# Patient Record
Sex: Female | Born: 1965 | Race: Black or African American | Hispanic: No | Smoking: Never smoker
Health system: Southern US, Community
[De-identification: ages and names within clinical notes are randomized; demographics above are authoritative.]

## PROBLEM LIST (undated history)

## (undated) DIAGNOSIS — K219 Gastro-esophageal reflux disease without esophagitis: Secondary | ICD-10-CM

## (undated) DIAGNOSIS — I10 Essential (primary) hypertension: Secondary | ICD-10-CM

## (undated) DIAGNOSIS — F329 Major depressive disorder, single episode, unspecified: Secondary | ICD-10-CM

## (undated) DIAGNOSIS — F32A Depression, unspecified: Secondary | ICD-10-CM

## (undated) DIAGNOSIS — D649 Anemia, unspecified: Secondary | ICD-10-CM

## (undated) DIAGNOSIS — M797 Fibromyalgia: Secondary | ICD-10-CM

## (undated) HISTORY — DX: Gastro-esophageal reflux disease without esophagitis: K21.9

## (undated) HISTORY — DX: Essential (primary) hypertension: I10

## (undated) HISTORY — DX: Major depressive disorder, single episode, unspecified: F32.9

## (undated) HISTORY — DX: Anemia, unspecified: D64.9

## (undated) HISTORY — DX: Depression, unspecified: F32.A

---

## 1992-10-23 HISTORY — PX: BREAST LUMPECTOMY: SHX2

## 1998-05-18 ENCOUNTER — Other Ambulatory Visit: Admission: RE | Admit: 1998-05-18 | Discharge: 1998-05-18 | Payer: Self-pay | Admitting: Obstetrics and Gynecology

## 1999-05-27 ENCOUNTER — Other Ambulatory Visit: Admission: RE | Admit: 1999-05-27 | Discharge: 1999-05-27 | Payer: Self-pay | Admitting: Obstetrics & Gynecology

## 2000-09-19 ENCOUNTER — Other Ambulatory Visit: Admission: RE | Admit: 2000-09-19 | Discharge: 2000-09-19 | Payer: Self-pay | Admitting: Obstetrics & Gynecology

## 2001-12-23 ENCOUNTER — Other Ambulatory Visit: Admission: RE | Admit: 2001-12-23 | Discharge: 2001-12-23 | Payer: Self-pay | Admitting: Obstetrics and Gynecology

## 2002-01-20 ENCOUNTER — Other Ambulatory Visit: Admission: RE | Admit: 2002-01-20 | Discharge: 2002-01-20 | Payer: Self-pay | Admitting: Obstetrics and Gynecology

## 2002-11-23 DIAGNOSIS — M797 Fibromyalgia: Secondary | ICD-10-CM

## 2002-11-23 HISTORY — DX: Fibromyalgia: M79.7

## 2003-04-01 ENCOUNTER — Other Ambulatory Visit: Admission: RE | Admit: 2003-04-01 | Discharge: 2003-04-01 | Payer: Self-pay | Admitting: Obstetrics and Gynecology

## 2003-04-29 ENCOUNTER — Inpatient Hospital Stay (HOSPITAL_COMMUNITY): Admission: AD | Admit: 2003-04-29 | Discharge: 2003-04-29 | Payer: Self-pay | Admitting: Obstetrics and Gynecology

## 2003-04-29 ENCOUNTER — Encounter: Payer: Self-pay | Admitting: Obstetrics and Gynecology

## 2003-06-10 ENCOUNTER — Encounter: Payer: Self-pay | Admitting: Obstetrics and Gynecology

## 2003-06-10 ENCOUNTER — Ambulatory Visit (HOSPITAL_COMMUNITY): Admission: RE | Admit: 2003-06-10 | Discharge: 2003-06-10 | Payer: Self-pay | Admitting: Obstetrics and Gynecology

## 2003-10-09 ENCOUNTER — Inpatient Hospital Stay (HOSPITAL_COMMUNITY): Admission: AD | Admit: 2003-10-09 | Discharge: 2003-10-11 | Payer: Self-pay | Admitting: Obstetrics and Gynecology

## 2003-10-14 ENCOUNTER — Encounter: Admission: RE | Admit: 2003-10-14 | Discharge: 2003-11-13 | Payer: Self-pay | Admitting: *Deleted

## 2003-11-14 ENCOUNTER — Encounter: Admission: RE | Admit: 2003-11-14 | Discharge: 2003-12-14 | Payer: Self-pay | Admitting: *Deleted

## 2003-12-15 ENCOUNTER — Encounter: Admission: RE | Admit: 2003-12-15 | Discharge: 2004-01-14 | Payer: Self-pay | Admitting: Obstetrics and Gynecology

## 2004-02-12 ENCOUNTER — Encounter: Admission: RE | Admit: 2004-02-12 | Discharge: 2004-03-13 | Payer: Self-pay | Admitting: *Deleted

## 2004-10-25 ENCOUNTER — Other Ambulatory Visit: Admission: RE | Admit: 2004-10-25 | Discharge: 2004-10-25 | Payer: Self-pay | Admitting: Obstetrics and Gynecology

## 2005-03-23 ENCOUNTER — Inpatient Hospital Stay (HOSPITAL_COMMUNITY): Admission: AD | Admit: 2005-03-23 | Discharge: 2005-03-26 | Payer: Self-pay | Admitting: Obstetrics and Gynecology

## 2005-04-02 ENCOUNTER — Inpatient Hospital Stay (HOSPITAL_COMMUNITY): Admission: AD | Admit: 2005-04-02 | Discharge: 2005-04-02 | Payer: Self-pay | Admitting: Obstetrics and Gynecology

## 2005-05-09 ENCOUNTER — Other Ambulatory Visit: Admission: RE | Admit: 2005-05-09 | Discharge: 2005-05-09 | Payer: Self-pay | Admitting: Obstetrics and Gynecology

## 2005-06-19 ENCOUNTER — Encounter (HOSPITAL_COMMUNITY): Admission: RE | Admit: 2005-06-19 | Discharge: 2005-09-17 | Payer: Self-pay | Admitting: Internal Medicine

## 2006-04-25 ENCOUNTER — Inpatient Hospital Stay (HOSPITAL_COMMUNITY): Admission: AD | Admit: 2006-04-25 | Discharge: 2006-04-28 | Payer: Self-pay | Admitting: Obstetrics and Gynecology

## 2006-05-02 ENCOUNTER — Inpatient Hospital Stay (HOSPITAL_COMMUNITY): Admission: AD | Admit: 2006-05-02 | Discharge: 2006-05-02 | Payer: Self-pay | Admitting: Obstetrics and Gynecology

## 2006-05-03 ENCOUNTER — Inpatient Hospital Stay (HOSPITAL_COMMUNITY): Admission: AD | Admit: 2006-05-03 | Discharge: 2006-05-05 | Payer: Self-pay | Admitting: Obstetrics and Gynecology

## 2006-05-23 ENCOUNTER — Encounter: Admission: RE | Admit: 2006-05-23 | Discharge: 2006-05-23 | Payer: Self-pay | Admitting: Internal Medicine

## 2006-05-23 ENCOUNTER — Encounter (INDEPENDENT_AMBULATORY_CARE_PROVIDER_SITE_OTHER): Payer: Self-pay | Admitting: Specialist

## 2006-05-23 ENCOUNTER — Other Ambulatory Visit: Admission: RE | Admit: 2006-05-23 | Discharge: 2006-05-23 | Payer: Self-pay | Admitting: Diagnostic Radiology

## 2006-06-07 ENCOUNTER — Other Ambulatory Visit: Admission: RE | Admit: 2006-06-07 | Discharge: 2006-06-07 | Payer: Self-pay | Admitting: Obstetrics and Gynecology

## 2007-07-01 ENCOUNTER — Other Ambulatory Visit: Admission: RE | Admit: 2007-07-01 | Discharge: 2007-07-01 | Payer: Self-pay | Admitting: Obstetrics and Gynecology

## 2008-12-02 ENCOUNTER — Emergency Department (HOSPITAL_COMMUNITY): Admission: EM | Admit: 2008-12-02 | Discharge: 2008-12-02 | Payer: Self-pay | Admitting: Emergency Medicine

## 2011-03-10 NOTE — Discharge Summary (Signed)
Victoria Curry, Victoria Curry              ACCOUNT NO.:  [id number]   MEDICAL RECORD NO.:  [id number]          PATIENT TYPE:  INP   LOCATION:  9105                          [facility]   PHYSICIAN:  James A. Ashley Royalty, M.D.DATE OF BIRTH:  08/08/1966   DATE OF ADMISSION:  04/25/2006  DATE OF DISCHARGE:  04/28/2006                                 DISCHARGE SUMMARY   DISCHARGE DIAGNOSES:  1.  Intrauterine pregnancy at 39-1/[redacted] weeks gestation, delivered.  2.  Advanced maternal age.  3.  Group B Streptococcus carrier.  4.  Term birth of living child, vertex.   CONSULTATIONS:  None.   DISCHARGE MEDICATIONS:  1.  __________ 100.  2.  Motrin 600 mg.   HISTORY AND PHYSICAL:  This is 45 year old, gravida 5, para 4, with the  aforementioned diagnoses, presenting complaining of contractions.  Initial  cervical examination revealed 3 cm diltation, 70% effacement, -1 station,  vertex presentation.   HOSPITAL COURSE:  The patient was admitted to [facility]  [place].  Initial laboratory studies were drawn.  She went on to labor  and delivered on April 26, 2006.  The infant was a 8 pound 14 ounce female,  Apgars 9 at 1 minute, 9 at 5 minutes, sent to newborn nursery.  Delivery was  accomplished by Dr. Sylvester Harder over an intact perineum.  There was a  small first-degree introital laceration which was repaired without  difficulty.  The patient's postpartum course was benign.  She was discharged  to home on April 28, 2006, afebrile and in satisfactory condition.   DISPOSITION:  The patient is to return to [facility]  in approximately 6 weeks for postpartum evaluation.      James A. Ashley Royalty, M.D.  Electronically Signed     JAM/MEDQ  D:  05/16/2006  T:  05/16/2006  Job:  [id number]

## 2011-03-10 NOTE — Discharge Summary (Signed)
Victoria Curry, Victoria Curry              ACCOUNT NO.:  [id number]   MEDICAL RECORD NO.:  [id number]          PATIENT TYPE:  INP   LOCATION:  9160                          [facility]   PHYSICIAN:  James A. Ashley Royalty, M.D.DATE OF BIRTH:  26-Feb-1966   DATE OF ADMISSION:  05/03/2006  DATE OF DISCHARGE:  05/05/2006                                 DISCHARGE SUMMARY   DISCHARGE DIAGNOSES:  1.  Status post spontaneous vaginal delivery on or about April 28, 2006.  2.  Preeclampsia - resolving.   CONSULTATIONS:  None.   DISCHARGE MEDICATIONS:  Prenatal vitamins.   HISTORY AND PHYSICAL:  This is a 45 year old gravida 5, para 5-0-0-5  approximately 5 days post vaginal delivery, presented the evening prior to  admission complaining of headache and blood pressure was noted to be 140-150  over 80-90.  PIH panel was obtained and the patient sent home per Dr. Richardson Dopp.  She was seen at Dr. Ashley Royalty' office on the day of admission complaining of  a headache.  PIH panel was rechecked and SGPT was noted to be 76, SGOT 44.  The patient was admitted for magnesium prophylaxis, monitoring of liver  function tests.  For the remainder of the history and physical please see  chart.   HOSPITAL COURSE:  The patient was admitted to [facility]  [place].  She was begun on magnesium sulfate.  Procardia was  discontinued.  PIH panel was obtained on July 14.  The SGOT had returned to  22, SGPT 48.  Blood pressures were 120s over 60s at this point.  Magnesium  sulfate was subsequently discontinued.  After a period of observation the  patient was deemed stable for discharge and was discharged home afebrile and  in satisfactory condition.   DISPOSITION:  The patient is to return to the office next week for further  evaluation and therapy.      James A. Ashley Royalty, M.D.  Electronically Signed     JAM/MEDQ  D:  05/16/2006  T:  05/16/2006  Job:  [id number]

## 2011-03-10 NOTE — H&P (Signed)
Victoria Curry, Victoria Curry              ACCOUNT NO.:  [id number]   MEDICAL RECORD NO.:  [id number]          PATIENT TYPE:  MAT   LOCATION:  MATC                          [facility]  [facility]   PHYSICIAN:  Rudy Jew. Ashley Royalty, M.D.DATE OF BIRTH:  Sep 10, 1966   DATE OF ADMISSION:  03/23/2005  DATE OF DISCHARGE:                                HISTORY & PHYSICAL   HISTORY OF PRESENT ILLNESS:  A 45 year old gravida 4, para 3, EDC March 26, 2005, at 39 weeks 4 days gestation.  Prenatal care was complicated by  advanced maternal age and fibroid uterus (small).  The patient did have a  genetic amniocentesis which revealed normal chromosomes.   The patient presented tonight complaining of contractions.  On the monitor,  they were not sufficiently frequent to warrant admission, however, there was  one sustained contraction which was accompanied by significant deceleration  which appeared late in onset.  The subsequent tracing appeared beautiful  with nice reactivity and no further decelerations.  Due to the fact that the  patient is 39+ weeks gestation and had a cervical examination quite  favorable for delivery (3 to 4 cm/50% effaced/high/vertex), the patient was  admitted for overnight observation with induction in the morning.   MEDICATIONS:  Vitamins.   PAST MEDICAL HISTORY:  Benign.   ALLERGIES:  No known drug allergies.   FAMILY HISTORY:  Noncontributory.   SOCIAL HISTORY:  The patient denies the use of tobacco or significant  alcohol.   REVIEW OF SYSTEMS:  Noncontributory.   PHYSICAL EXAMINATION:  GENERAL:  A well-developed, well-nourished, pleasant  black female in no acute distress.  VITAL SIGNS:  Afebrile, vital signs stable.  SKIN:  Warm and dry without lesions.  LYMPHS:  There is no supraclavicular, cervical, or inguinal adenopathy.  HEENT:  Normocephalic.  NECK:  Supple without thyromegaly.  CHEST:  Lungs are clear.  HEART:  Regular rate and rhythm without murmurs, rubs, or  gallops.  ABDOMEN:  Gravid with a term fundal height.  Contractions are irregular.  Fetal heart tones are approximately 140 with accels.  Please see above  noted.  MUSCULOSKELETAL:  No CVA tenderness.  PELVIC:  Per RN - see above.   Biophysical profile revealed a value of 6/8.  The amniotic fluid was noted  to be 10.9 cm.   IMPRESSION:  1.  Intrauterine pregnancy at 39+ weeks gestation.  2.  Advanced cervical changes.  3.  Late decelerations after sustained contraction x1 only.   PLAN:  Admit and observe the patient overnight.  Induction in a.m.      JAM/MEDQ  D:  03/23/2005  T:  03/23/2005  Job:  [id number]
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
## 2011-03-10 NOTE — Consult Note (Signed)
Victoria Curry, Victoria Curry              ACCOUNT NO.:  [id number]   MEDICAL RECORD NO.:  [id number]          PATIENT TYPE:  MAT   LOCATION:  MATC                          [facility]   PHYSICIAN:  Charles A. Delcambre, MDDATE OF BIRTH:  07/05/1966   DATE OF CONSULTATION:  04/02/2005  DATE OF DISCHARGE:                                   CONSULTATION   EMERGENCY ROOM CONSULTATION:   REASON FOR CONSULTATION:  45 year old para 4-0-0-4 status post  vaginal delivery on June 2 now postpartum day #9.  Last pregnancy  complicated by pregnancy-induced hypertension right at the end of the  pregnancy, on blood pressure medicine during the pregnancy at the last week  induced and then nothing after the pregnancy, no magnesium prophylaxis  during that pregnancy, this pregnancy normal pregnancy without hypertension  per patient during this pregnancy, no complications at delivery with  hypertension per history.  Old chart not available for my review at this  time.  She presented this evening complaining of headache onset 1530 hours  this afternoon, no scotomata, no right upper quadrant pain, no blurred  vision but temporal headache and checked her blood pressure at home with a  home cuff and got 120/100.  She was seen last week on Monday, 6 days ago,  with Dr. Ashley Royalty, noted to have a high pressure that day, not specified  what it was, was told to come back the next day for blood pressure check and  pressure was normal.  She did not have any blood work done at that time and  had been on normal activity since that time.  She states she had some  swelling today and was directed to come in for blood pressure check and lab  work to be done.   PAST MEDICAL HISTORY:  None.   SURGICAL HISTORY:  Lumpectomy breast benign.  SVD x4.   MEDICATIONS:  None.   ALLERGIES:  CODEINE causes itching.   SOCIAL HISTORY:  No tobacco, ethanol or drug use.  Patient is married in a  monogamous relationship with  her husband.   FAMILY HISTORY:  Unrelated.   REVIEW OF SYSTEMS:  Review as noted above.   PHYSICAL EXAMINATION:  GENERAL:  Patient alert and oriented x3.  VITAL SIGNS:  Blood pressure initially here 151/104, repeat 162/101, 149/95,  164/108, 157/102, 158/97, respirations 18, pulse 55, temperature 98.6.  HEENT EXAM:  Grossly within normal limits.  NECK:  Neck supple without thyromegaly or adenopathy.  LUNGS:  Clear bilaterally.  HEART:  Regular rate and rhythm, 2/6 systolic ejection murmur left sternal  border.  BREASTS:  Symmetrical.  Remainder of exam deferred.  ABDOMEN:  Soft, flat, nontender, fundus firm 16 weeks size.  Pelvic exam  deferred.  EXTREMITIES:  Moderate pitting edema up to mid calf.   LABORATORIES:  LDH 241.  CMP, SGOT/SGPT normal.  Creatinine 1.  Uric acid  8.1.  CBC:  Platelets 324, hemoglobin 11.5, hematocrit 34.6.  Urine negative  for protein.   ASSESSMENT:  Late onset pregnancy-induced hypertension.   PLAN:  Recommendation for admission and magnesium prophylaxis.  Patient  refuses admission at this time for family reasons.  Discussion is made that  she may have seizure but stroke may be a risk as well.  Oral agents will be  administered to try to control hypertension on an outpatient basis and she  is in agreement with this recommendation at least in that she will not agree  to admission, she accepts risks as noted above and we will go ahead and  start Aldomet 500 mg b.i.d. first dose tonight, prescription will be filled,  she will take second dose in the morning, call Dr. Ashley Royalty' office to the  nurse practitioner in his absence tomorrow and she will notify covering  doctor, Dr. Mia Creek tomorrow if pressure is high.  Otherwise, she is  strictly precautioned if headache is to worsen, scotomata develop, right  upper quadrant pain or blurred vision or any other worsening factors or if  she just does not feel right she is to the return to the MAU as I would   recommend admission and magnesium prophylaxis.  She is in agreement and  accepts risks as noted and will follow up as directed.  What I would like to  give her is labetalol but we will withhold this secondary to her low pulse  rate in the 50s at this time.       CAD/MEDQ  D:  04/02/2005  T:  04/02/2005  Job:  [id number]

## 2013-01-09 ENCOUNTER — Emergency Department (HOSPITAL_COMMUNITY): Admission: EM | Admit: 2013-01-09 | Discharge: 2013-01-09 | Discharge status: Absent without leave | Payer: Self-pay

## 2013-03-10 ENCOUNTER — Encounter (HOSPITAL_COMMUNITY): Payer: Self-pay | Admitting: *Deleted

## 2013-03-10 DIAGNOSIS — K219 Gastro-esophageal reflux disease without esophagitis: Secondary | ICD-10-CM | POA: Insufficient documentation

## 2013-03-10 DIAGNOSIS — K3189 Other diseases of stomach and duodenum: Secondary | ICD-10-CM | POA: Insufficient documentation

## 2013-03-10 DIAGNOSIS — Z79899 Other long term (current) drug therapy: Secondary | ICD-10-CM | POA: Insufficient documentation

## 2013-03-10 DIAGNOSIS — R109 Unspecified abdominal pain: Secondary | ICD-10-CM | POA: Insufficient documentation

## 2013-03-10 DIAGNOSIS — Z8739 Personal history of other diseases of the musculoskeletal system and connective tissue: Secondary | ICD-10-CM | POA: Insufficient documentation

## 2013-03-10 DIAGNOSIS — Z3202 Encounter for pregnancy test, result negative: Secondary | ICD-10-CM | POA: Insufficient documentation

## 2013-03-10 DIAGNOSIS — Z91040 Latex allergy status: Secondary | ICD-10-CM | POA: Insufficient documentation

## 2013-03-10 LAB — COMPREHENSIVE METABOLIC PANEL
AST: 19 U/L (ref 0–37)
Alkaline Phosphatase: 71 U/L (ref 39–117)
Calcium: 9.5 mg/dL (ref 8.4–10.5)
Chloride: 102 mEq/L (ref 96–112)
Creatinine, Ser: 0.77 mg/dL (ref 0.50–1.10)
GFR calc Af Amer: >90 mL/min (ref >90)
GFR calc non Af Amer: >90 mL/min (ref >90)
Potassium: 4 mEq/L (ref 3.5–5.1)
Sodium: 135 mEq/L (ref 135–145)
Total Bilirubin: 0.2 mg/dL — ABNORMAL LOW (ref 0.3–1.2)

## 2013-03-10 LAB — CBC WITH DIFFERENTIAL/PLATELET
Basophils Absolute: 0 10*3/uL (ref 0.0–0.1)
Basophils Relative: 0 % (ref 0–1)
Eosinophils Absolute: 0.1 10*3/uL (ref 0.0–0.7)
MCH: 29.7 pg (ref 26.0–34.0)
MCV: 89.9 fL (ref 78.0–100.0)
Monocytes Absolute: 0.7 10*3/uL (ref 0.1–1.0)
Neutrophils Relative %: 55 % (ref 43–77)
RBC: 3.87 MIL/uL (ref 3.87–5.11)
WBC: 8.9 10*3/uL (ref 4.0–10.5)

## 2013-03-10 LAB — URINALYSIS, ROUTINE W REFLEX MICROSCOPIC
Glucose, UA: NEGATIVE mg/dL
Leukocytes, UA: NEGATIVE
Specific Gravity, Urine: 1.008 (ref 1.005–1.030)
Urobilinogen, UA: 0.2 mg/dL (ref 0.0–1.0)

## 2013-03-10 LAB — URINE MICROSCOPIC-ADD ON

## 2013-03-10 LAB — POCT PREGNANCY, URINE: Preg Test, Ur: NEGATIVE

## 2013-03-10 MED ORDER — ONDANSETRON 4 MG PO TBDP
8.0000 mg | ORAL_TABLET | Freq: Once | ORAL | Status: AC
  Administered 2013-03-10: 8 mg via ORAL
  Filled 2013-03-10: qty 2

## 2013-03-10 NOTE — ED Notes (Signed)
C/o indigestion, nausea

## 2013-03-11 ENCOUNTER — Emergency Department (HOSPITAL_COMMUNITY): Payer: BC Managed Care – PPO

## 2013-03-11 ENCOUNTER — Emergency Department (HOSPITAL_COMMUNITY)
Admission: EM | Admit: 2013-03-11 | Discharge: 2013-03-11 | Disposition: A | Discharge status: Home / Self Care | Payer: BC Managed Care – PPO | Attending: Emergency Medicine | Admitting: Emergency Medicine

## 2013-03-11 DIAGNOSIS — R1010 Upper abdominal pain, unspecified: Secondary | ICD-10-CM

## 2013-03-11 DIAGNOSIS — R1013 Epigastric pain: Secondary | ICD-10-CM

## 2013-03-11 DIAGNOSIS — R110 Nausea: Secondary | ICD-10-CM

## 2013-03-11 HISTORY — DX: Fibromyalgia: M79.7

## 2013-03-11 MED ORDER — ONDANSETRON 8 MG PO TBDP: 8.0000 mg | ORAL_TABLET | Freq: Three times a day (TID) | ORAL | Status: DC | PRN

## 2013-03-11 MED ORDER — DICYCLOMINE HCL 10 MG PO CAPS
20.0000 mg | ORAL_CAPSULE | Freq: Once | ORAL | Status: AC
  Administered 2013-03-11: 20 mg via ORAL
  Filled 2013-03-11: qty 2

## 2013-03-11 MED ORDER — DICYCLOMINE HCL 20 MG PO TABS: 20.0000 mg | ORAL_TABLET | Freq: Four times a day (QID) | ORAL | Status: DC | PRN

## 2013-03-11 NOTE — ED Notes (Signed)
Patient given discharge instructions for abdominal pain. rx for zofran and bentyl. Advised to follow up with primary care. Patient voiced understanding of all instructions and had no further questions. Patient ambulated to front lobby without difficulty.

## 2013-03-11 NOTE — ED Provider Notes (Signed)
History     CSN: [id number]  Arrival date & time 03/10/13  2140   First MD Initiated Contact with Patient 03/11/13 0037      Chief Complaint  Patient presents with  . Nausea  . Gastrophageal Reflux    (Consider location/radiation/quality/duration/timing/severity/associated sxs/prior treatment) HPI 47 yo female presents to the ER with complaint of upper abdominal pain with radiation into her right shoulder area.  She has had nausea and vomiting.  No fevers.  Sxs started around 9 pm. No improvement with belching.  Pt c/o "bubble" in her right upper back.  No prior h/o same.  Past Medical History  Diagnosis Date  . Fibromyalgia     Past Surgical History  Procedure Laterality Date  . Breast lumpectomy      No family history on file.  History  Substance Use Topics  . Smoking status: Never Smoker   . Smokeless tobacco: Not on file  . Alcohol Use: No    OB History   Grav Para Term Preterm Abortions TAB SAB Ect Mult Living                  Review of Systems  See History of Present Illness; otherwise all other systems are reviewed and negative  Allergies  Adhesive; Codeine; and Latex  Home Medications   Current Outpatient Rx  Name  Route  Sig  Dispense  Refill  . dexlansoprazole (DEXILANT) 60 MG capsule   Oral   Take 60 mg by mouth daily as needed (for indigestion).         . DULoxetine (CYMBALTA) 30 MG capsule   Oral   Take 30 mg by mouth daily.         Marland Kitchen losartan (COZAAR) 100 MG tablet   Oral   Take 100 mg by mouth daily.         . nebivolol (BYSTOLIC) 10 MG tablet   Oral   Take 10 mg by mouth daily.         Marland Kitchen dicyclomine (BENTYL) 20 MG tablet   Oral   Take 1 tablet (20 mg total) by mouth every 6 (six) hours as needed (for abdominal cramping).   20 tablet   0   . ondansetron (ZOFRAN-ODT) 8 MG disintegrating tablet   Oral   Take 1 tablet (8 mg total) by mouth every 8 (eight) hours as needed for nausea.   20 tablet   0     BP 141/101   Pulse 65  Temp(Src) 97.9 F (36.6 C) (Oral)  Resp 18  SpO2 100%  LMP 02/20/2013  Physical Exam  Nursing note and vitals reviewed. Constitutional: She is oriented to person, place, and time. She appears well-developed and well-nourished.  HENT:  Head: Normocephalic and atraumatic.  Nose: Nose normal.  Mouth/Throat: Oropharynx is clear and moist.  Eyes: Conjunctivae and EOM are normal. Pupils are equal, round, and reactive to light.  Neck: Normal range of motion. Neck supple. No JVD present. No tracheal deviation present. No thyromegaly present.  Cardiovascular: Normal rate, regular rhythm, normal heart sounds and intact distal pulses.  Exam reveals no gallop and no friction rub.   No murmur heard. Pulmonary/Chest: Effort normal and breath sounds normal. No stridor. No respiratory distress. She has no wheezes. She has no rales. She exhibits no tenderness.  Abdominal: Soft. Bowel sounds are normal. She exhibits no distension and no mass. There is tenderness (mild right upper quadrant and epigastric pain). There is no rebound and no guarding.  Musculoskeletal: Normal range of motion. She exhibits no edema and no tenderness.  Lymphadenopathy:    She has no cervical adenopathy.  Neurological: She is alert and oriented to person, place, and time. She exhibits normal muscle tone. Coordination normal.  Skin: Skin is warm and dry. No rash noted. No erythema. No pallor.  Psychiatric: She has a normal mood and affect. Her behavior is normal. Judgment and thought content normal.    ED Course  Procedures (including critical care time)  Labs Reviewed  CBC WITH DIFFERENTIAL - Abnormal; Notable for the following:    Hemoglobin 11.5 (*)    HCT 34.8 (*)    All other components within normal limits  URINALYSIS, ROUTINE W REFLEX MICROSCOPIC - Abnormal; Notable for the following:    Hgb urine dipstick TRACE (*)    All other components within normal limits  COMPREHENSIVE METABOLIC PANEL - Abnormal;  Notable for the following:    Total Bilirubin 0.2 (*)    All other components within normal limits  URINE MICROSCOPIC-ADD ON  POCT PREGNANCY, URINE   US Abdomen Complete  03/11/2013   *RADIOLOGY REPORT*  Clinical Data:  Right upper quadrant abdominal pain.  COMPLETE ABDOMINAL ULTRASOUND  Comparison:  None.  Findings:  Gallbladder:  No gallstones, gallbladder wall thickening, or pericholecystic fluid.  Common bile duct:  Normal in caliber measuring a maximum of 5.63mm.  Liver:  Normal echogenicity.  No biliary dilatation.  There is a 3.5 x 2.1 x 2.9 cm multi septated cyst.  IVC:  Normal caliber.  Pancreas:  Sonographically normal.  Spleen:  Normal size and echogenicity.  A small calcification is noted, likely a benign granuloma.  Right Kidney:  11.4 cm in length. Normal renal cortical thickness and echogenicity without focal lesions or hydronephrosis.  Left Kidney:  10.5 cm in length. Normal renal cortical thickness and echogenicity without focal lesions or hydronephrosis.  Abdominal aorta:  Normal caliber.  IMPRESSION:  1.  Normal gallbladder and normal caliber common bile duct. 2.  Multiseptated cystic lesion in the right hepatic lobe. Recommend follow-up ultrasound examination in 6 months.   Original Report Authenticated By: Rudie Meyer, M.D.     1. Nausea   2. Upper abdominal pain   3. Dyspepsia       MDM  47 year old female with epigastric pain radiating into her right shoulder.  Concern for gallstones.  Ultrasound, however, does not show any specific gallbladder disease.  Patient feeling much better after Bentyl and Zofran.  Will have her followup with her primary care Dr. with possible referral to gastroenterology as needed.        Olivia Mackie, MD 03/12/13 [phone]

## 2013-03-11 NOTE — ED Notes (Signed)
Pharmacy in room at this time.

## 2013-03-11 NOTE — ED Notes (Signed)
Patient ambulating to restroom without difficulty at this time. 

## 2013-07-09 ENCOUNTER — Other Ambulatory Visit: Payer: Self-pay | Admitting: Internal Medicine

## 2013-07-09 DIAGNOSIS — K7689 Other specified diseases of liver: Secondary | ICD-10-CM

## 2013-09-01 ENCOUNTER — Other Ambulatory Visit: Payer: BC Managed Care – PPO

## 2013-09-24 ENCOUNTER — Encounter: Payer: Self-pay | Admitting: Family

## 2013-10-02 ENCOUNTER — Ambulatory Visit
Admission: RE | Admit: 2013-10-02 | Discharge: 2013-10-02 | Disposition: A | Discharge status: Home / Self Care | Payer: BC Managed Care – PPO | Source: Ambulatory Visit | Attending: Internal Medicine | Admitting: Internal Medicine

## 2013-10-02 DIAGNOSIS — K7689 Other specified diseases of liver: Secondary | ICD-10-CM

## 2013-11-06 ENCOUNTER — Other Ambulatory Visit: Payer: Self-pay | Admitting: Internal Medicine

## 2013-11-06 DIAGNOSIS — K7689 Other specified diseases of liver: Secondary | ICD-10-CM

## 2013-11-11 ENCOUNTER — Ambulatory Visit
Admission: RE | Admit: 2013-11-11 | Discharge: 2013-11-11 | Disposition: A | Discharge status: Home / Self Care | Payer: BC Managed Care – PPO | Source: Ambulatory Visit | Attending: Internal Medicine | Admitting: Internal Medicine

## 2013-11-11 DIAGNOSIS — K7689 Other specified diseases of liver: Secondary | ICD-10-CM

## 2013-11-11 MED ORDER — GADOBENATE DIMEGLUMINE 529 MG/ML IV SOLN
18.0000 mL | Freq: Once | INTRAVENOUS | Status: AC | PRN
  Administered 2013-11-11: 18 mL via INTRAVENOUS

## 2014-01-25 IMAGING — US US ABDOMEN COMPLETE
1 series · 14 of 25 positions shown · non-contrast
Comparison: None.

CLINICAL DATA: Followup complex hepatic cyst.

EXAM:
ULTRASOUND ABDOMEN COMPLETE

[Series 1: us abdomen complete · 0.26mm/px · 14 of 89 slices shown]
[im 1/89]
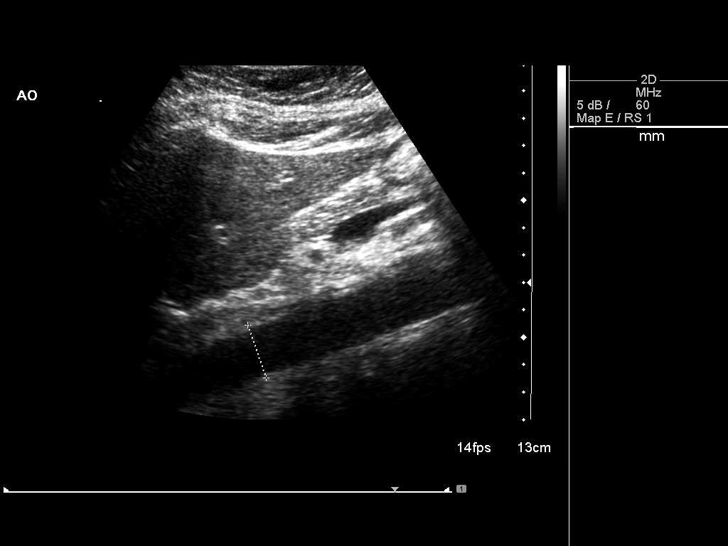
[im 8/89]
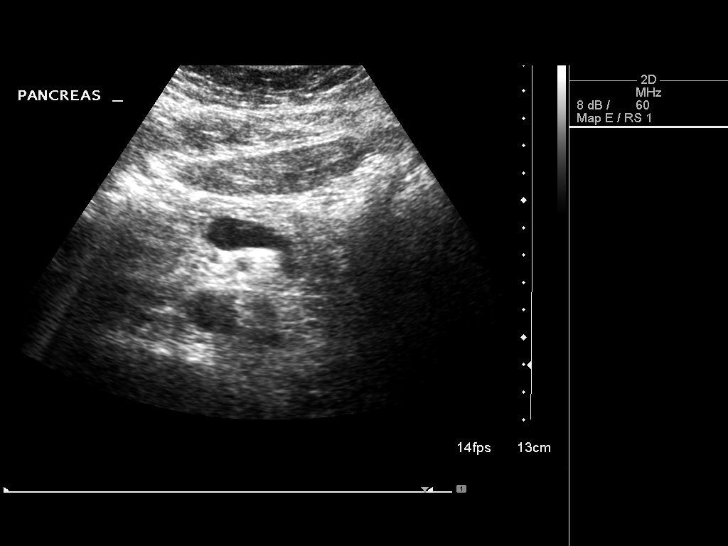
[im 15/89]
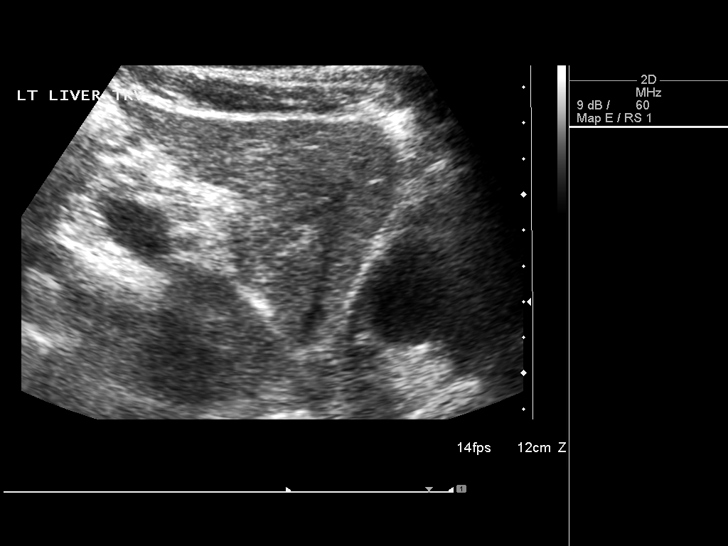
[im 23/89]
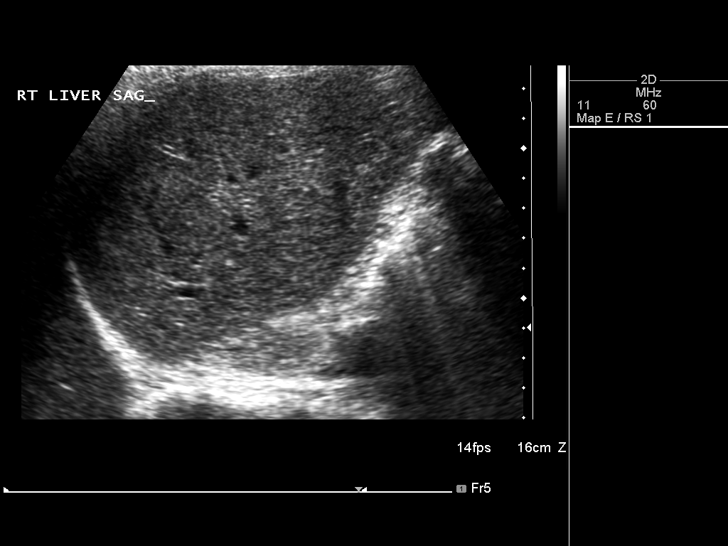
[im 30/89]
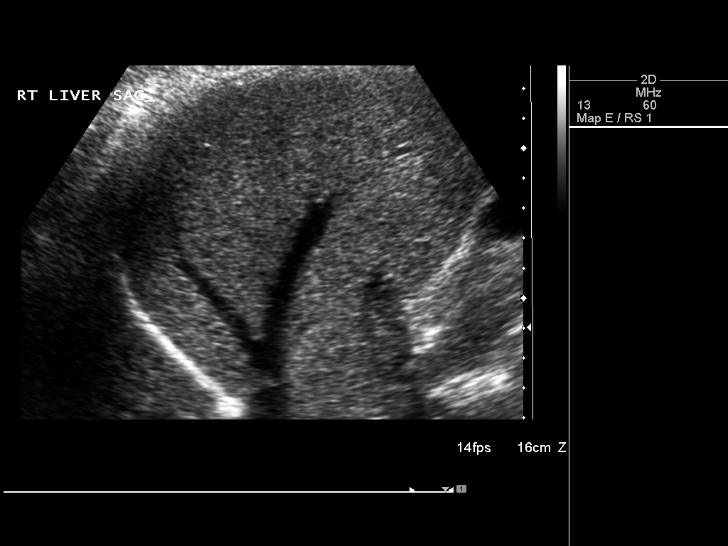
[im 34/89]
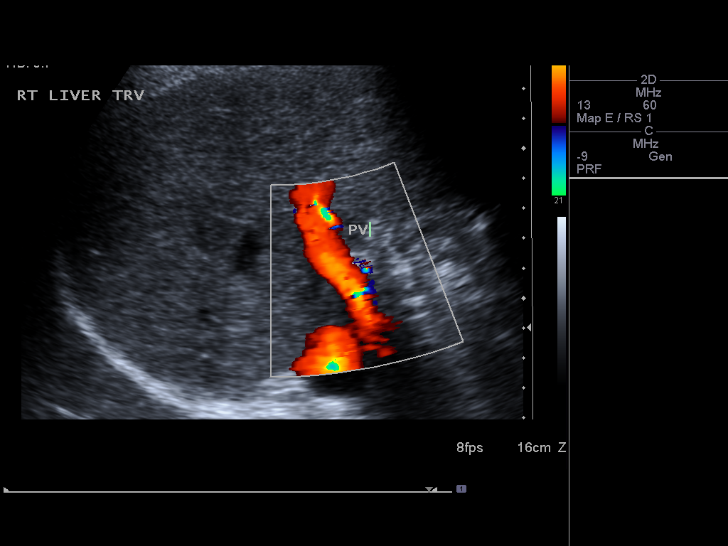
[im 41/89]
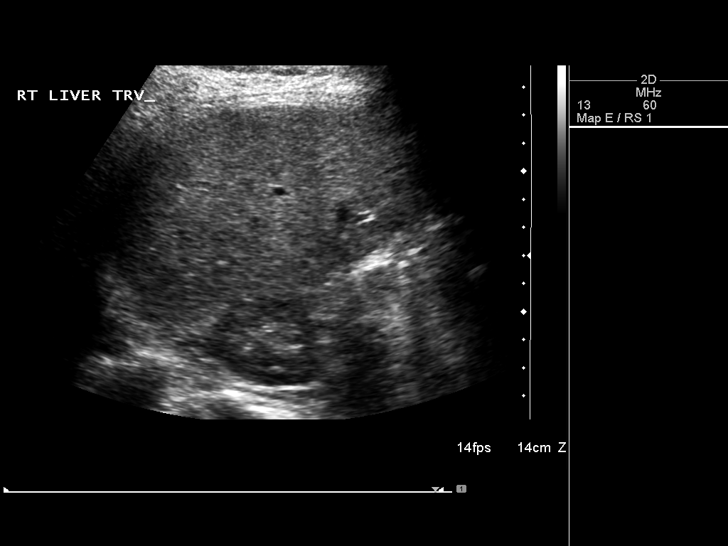
[im 48/89]
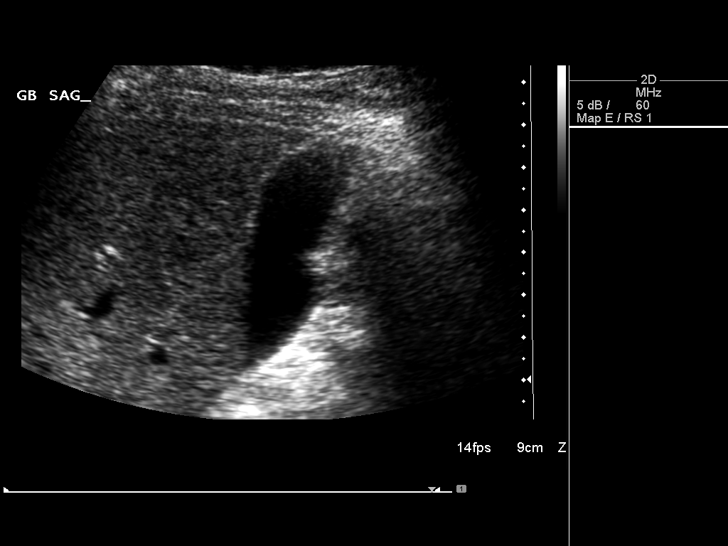
[im 56/89]
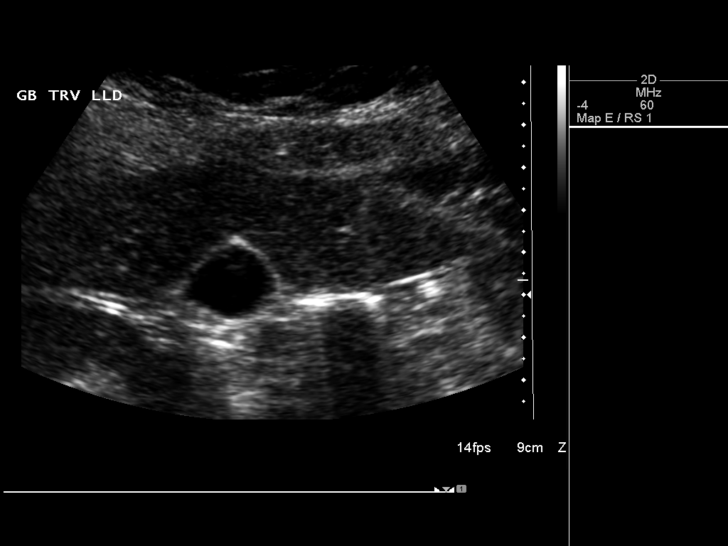
[im 59/89]
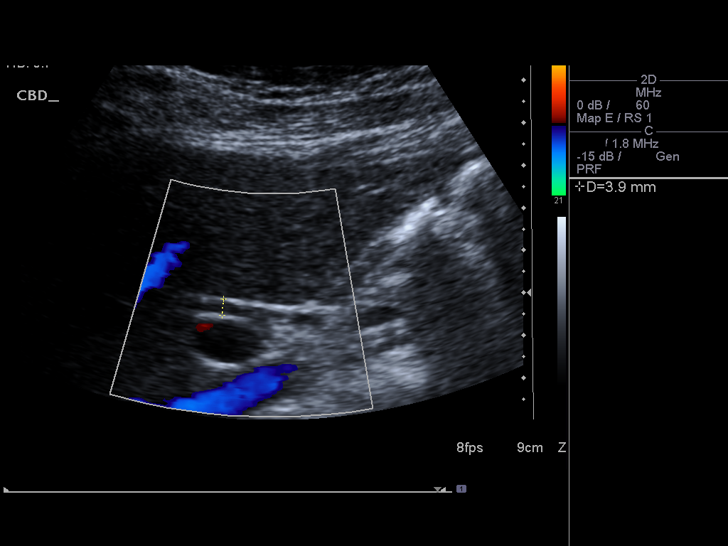
[im 67/89]
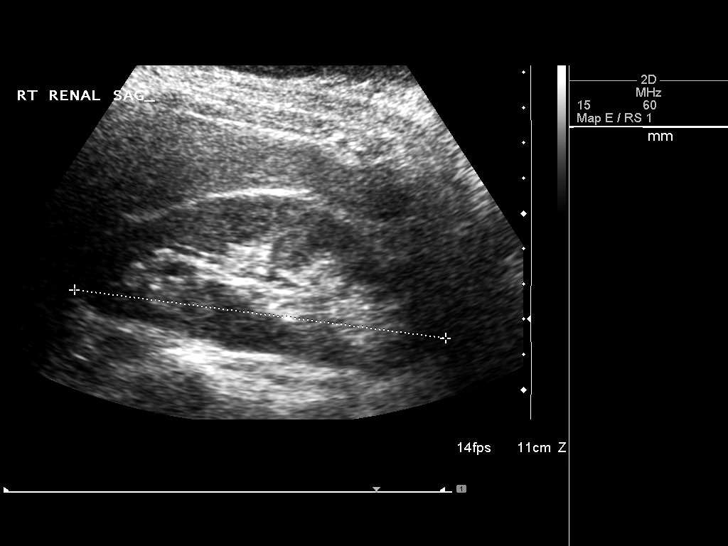
[im 74/89]
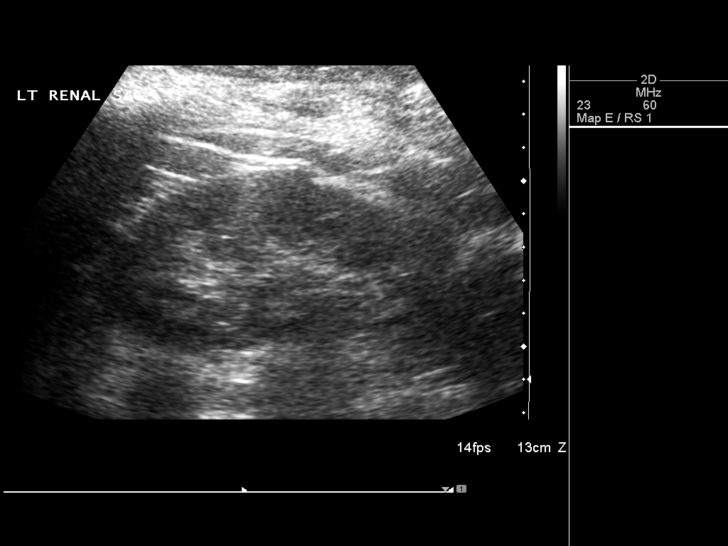
[im 81/89]
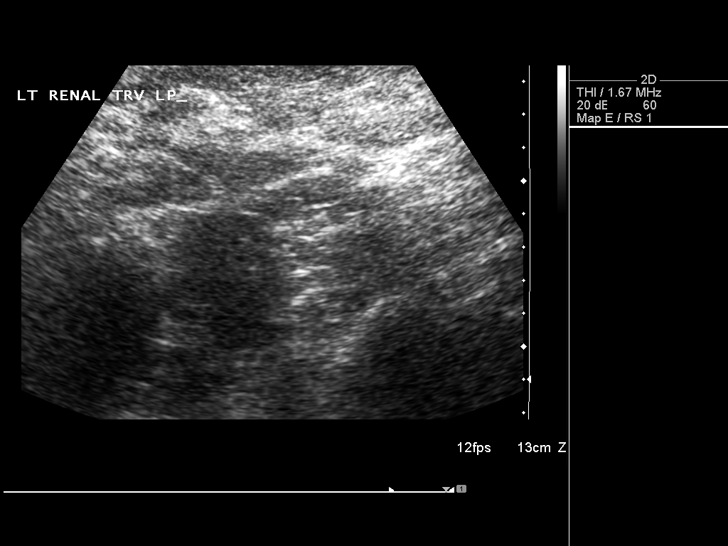
[im 89/89]
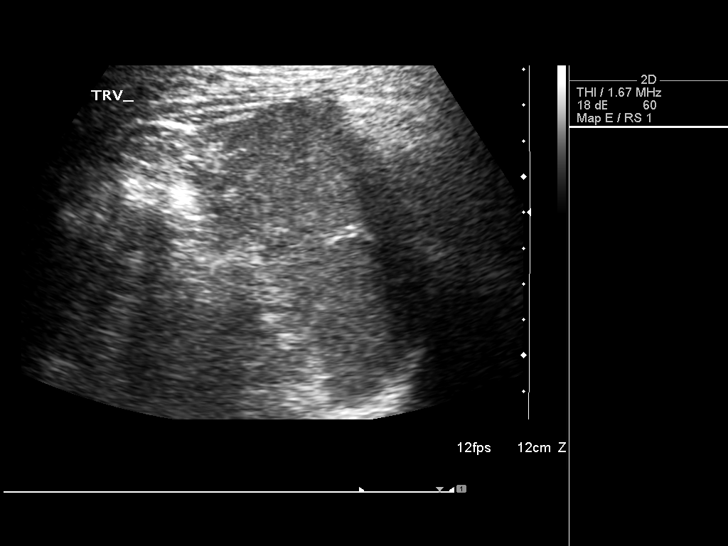

[14 of 25 positions shown; findings below may reference images not displayed]

FINDINGS: Gallbladder:

No gallstones or wall thickening visualized. No sonographic Murphy
sign noted.

Common bile duct:

Diameter: 4 mm

Liver:

A cyst containing several thin internal septations is again seen in
the right hepatic lobe. This measures 3.5 x 2.7 x 4.0 cm, which is
mildly increased in size since previous study. No definite solid
components visualized. No other liver lesions identified.

IVC:

No abnormality visualized.

Pancreas:

Visualized portion unremarkable.

Spleen:

Size and appearance within normal limits.

Right Kidney:

Length: 11.6 cm. Echogenicity within normal limits. No mass or
hydronephrosis visualized.

Left Kidney:

Length: 10.6 cm. Echogenicity within normal limits. No mass or
hydronephrosis visualized.

Abdominal aorta:

No aneurysm visualized.

Other findings:

None.
IMPRESSION: 4 cm complex hepatic cyst measurements are mildly increased since
prior study, although morphology of this lesion shows no significant
change. Recommend further characterization by abdomen MRI without
and with contrast.

## 2015-01-08 ENCOUNTER — Ambulatory Visit: Payer: BC Managed Care – PPO | Attending: Internal Medicine

## 2015-08-30 ENCOUNTER — Ambulatory Visit: Payer: BC Managed Care – PPO

## 2015-09-01 ENCOUNTER — Ambulatory Visit: Payer: BC Managed Care – PPO | Attending: Internal Medicine

## 2015-10-08 ENCOUNTER — Ambulatory Visit (INDEPENDENT_AMBULATORY_CARE_PROVIDER_SITE_OTHER): Payer: Self-pay | Admitting: Family Medicine

## 2015-10-08 ENCOUNTER — Non-Acute Institutional Stay (HOSPITAL_COMMUNITY)
Admission: AD | Admit: 2015-10-08 | Discharge: 2015-10-08 | Disposition: A | Discharge status: Home / Self Care | Payer: BC Managed Care – PPO | Attending: Internal Medicine | Admitting: Internal Medicine

## 2015-10-08 ENCOUNTER — Encounter: Payer: Self-pay | Admitting: Family Medicine

## 2015-10-08 VITALS — BP 160/94 | HR 72 | Temp 98.4°F | Resp 16 | Ht 67.0 in | Wt 192.0 lb

## 2015-10-08 DIAGNOSIS — F329 Major depressive disorder, single episode, unspecified: Secondary | ICD-10-CM

## 2015-10-08 DIAGNOSIS — I10 Essential (primary) hypertension: Secondary | ICD-10-CM

## 2015-10-08 DIAGNOSIS — M797 Fibromyalgia: Secondary | ICD-10-CM

## 2015-10-08 DIAGNOSIS — E8881 Metabolic syndrome: Secondary | ICD-10-CM | POA: Insufficient documentation

## 2015-10-08 DIAGNOSIS — F32A Depression, unspecified: Secondary | ICD-10-CM

## 2015-10-08 DIAGNOSIS — K219 Gastro-esophageal reflux disease without esophagitis: Secondary | ICD-10-CM | POA: Insufficient documentation

## 2015-10-08 DIAGNOSIS — Z87891 Personal history of nicotine dependence: Secondary | ICD-10-CM | POA: Insufficient documentation

## 2015-10-08 LAB — COMPLETE METABOLIC PANEL WITH GFR
ALT: 11 U/L (ref 6–29)
AST: 17 U/L (ref 10–35)
Albumin: 3.7 g/dL (ref 3.6–5.1)
Alkaline Phosphatase: 53 U/L (ref 33–115)
BILIRUBIN TOTAL: 0.4 mg/dL (ref 0.2–1.2)
BUN: 11 mg/dL (ref 7–25)
CALCIUM: 8.8 mg/dL (ref 8.6–10.2)
CHLORIDE: 102 mmol/L (ref 98–110)
CO2: 27 mmol/L (ref 20–31)
CREATININE: 0.75 mg/dL (ref 0.50–1.10)
GFR, Est African American: >89 mL/min (ref >=60)
GFR, Est Non African American: >89 mL/min (ref >=60)
Glucose, Bld: 77 mg/dL (ref 65–99)
Potassium: 3.9 mmol/L (ref 3.5–5.3)
Sodium: 137 mmol/L (ref 135–146)
TOTAL PROTEIN: 6.6 g/dL (ref 6.1–8.1)

## 2015-10-08 LAB — CBC WITH DIFFERENTIAL/PLATELET
Basophils Absolute: 0 K/uL (ref 0.0–0.1)
Basophils Relative: 0 % (ref 0–1)
Eosinophils Absolute: 0.1 K/uL (ref 0.0–0.7)
Eosinophils Relative: 1 % (ref 0–5)
HCT: 34.3 % — ABNORMAL LOW (ref 36.0–46.0)
Hemoglobin: 11.1 g/dL — ABNORMAL LOW (ref 12.0–15.0)
Lymphocytes Relative: 27 % (ref 12–46)
Lymphs Abs: 2.2 K/uL (ref 0.7–4.0)
MCH: 30 pg (ref 26.0–34.0)
MCHC: 32.4 g/dL (ref 30.0–36.0)
MCV: 92.7 fL (ref 78.0–100.0)
MPV: 9.4 fL (ref 8.6–12.4)
Monocytes Absolute: 0.8 K/uL (ref 0.1–1.0)
Monocytes Relative: 10 % (ref 3–12)
Neutro Abs: 5 K/uL (ref 1.7–7.7)
Neutrophils Relative %: 62 % (ref 43–77)
Platelets: 350 K/uL (ref 150–400)
RBC: 3.7 MIL/uL — ABNORMAL LOW (ref 3.87–5.11)
RDW: 14.1 % (ref 11.5–15.5)
WBC: 8 K/uL (ref 4.0–10.5)

## 2015-10-08 LAB — POCT URINALYSIS DIP (DEVICE)
Bilirubin Urine: NEGATIVE
Glucose, UA: NEGATIVE mg/dL
Ketones, ur: NEGATIVE mg/dL
Leukocytes, UA: NEGATIVE
Nitrite: NEGATIVE
Protein, ur: NEGATIVE mg/dL
Specific Gravity, Urine: 1.025 (ref 1.005–1.030)
Urobilinogen, UA: 0.2 mg/dL (ref 0.0–1.0)
pH: 6 (ref 5.0–8.0)

## 2015-10-08 LAB — LIPID PANEL
Cholesterol: 131 mg/dL (ref 125–200)
HDL: 48 mg/dL
LDL Cholesterol: 70 mg/dL
Total CHOL/HDL Ratio: 2.7 ratio
Triglycerides: 67 mg/dL
VLDL: 13 mg/dL

## 2015-10-08 LAB — TSH: TSH: 1.488 u[IU]/mL (ref 0.350–4.500)

## 2015-10-08 MED ORDER — DULOXETINE HCL 30 MG PO CPEP: 30.0000 mg | ORAL_CAPSULE | Freq: Two times a day (BID) | ORAL | Status: DC

## 2015-10-08 MED ORDER — LOSARTAN POTASSIUM 100 MG PO TABS: 100.0000 mg | ORAL_TABLET | Freq: Every day | ORAL | Status: DC

## 2015-10-08 MED ORDER — DEXLANSOPRAZOLE 60 MG PO CPDR: 60.0000 mg | DELAYED_RELEASE_CAPSULE | Freq: Every day | ORAL | Status: DC

## 2015-10-08 NOTE — Progress Notes (Signed)
Subjective:    Patient ID: Victoria Curry, female    DOB: [birth year], 49 y.o.   MRN: [id number]  HPI  Victoria Curry, a 49 year old female with a history of hypertension, GERD, fibromyalgia, and depression presents to establish care. She was previously under the care of Dr. Waunita Schooner, [facility], did not follow up due to lack of payer source. Patient has a history of hypertension, and states that she ran out of medications in August. She is not exercising and is not adherent to low salt diet. She does not check blood pressures at home.   Patient denies chest pain, dyspnea, fatigue, lower extremity edema, palpitations and tachypnea.  Paitent complains of heartburn. This has been associated with belching and eructation, heartburn and nausea.  She denies chest pain, choking on food, difficulty swallowing, dysphagia, early satiety, fullness after meals, midespigastric pain, need to clear throat frequently and shortness of breath. Symptoms have been present for several years. She denies dysphagia.  She has not lost weight. She denies melena, hematochezia, hematemesis, and coffee ground emesis. Medical therapy in the past has included proton pump inhibitors she state that she was on Protonix 20 mg. Patient also complains of depression. She complains of depressed mood and fatigue. She has a history of fibromyalgia, which is related to stress. .  She denies current suicidal and homicidal plan or intent. Symptoms are currently controlled on Cymbalta twice daily Past Medical History  Diagnosis Date  . Fibromyalgia   . Hypertension   . Depression   . GERD (gastroesophageal reflux disease)    Social History   Social History  . Marital Status: Married    Spouse Name: N/A  . Number of Children: N/A  . Years of Education: N/A   Occupational History  . Not on file.   Social History Main Topics  . Smoking status: Former Research scientist (life sciences)  . Smokeless tobacco: Not on file  . Alcohol Use:  No  . Drug Use: No  . Sexual Activity: Not on file   Other Topics Concern  . Not on file   Social History Narrative   Allergies  Allergen Reactions  . Adhesive [Tape] Other (See Comments)    Skin irritation   . Codeine Itching  . Latex Other (See Comments)    Skin irritation     Review of Systems  Constitutional: Positive for fatigue. Negative for fever, activity change, appetite change and unexpected weight change.  HENT: Negative.   Eyes: Negative.  Negative for visual disturbance.  Respiratory: Negative.  Negative for apnea, chest tightness and shortness of breath.   Cardiovascular: Negative.   Gastrointestinal: Negative.  Negative for nausea, abdominal pain, diarrhea, constipation and blood in stool.       History of heartburn  Endocrine: Negative.  Negative for polydipsia, polyphagia and polyuria.  Musculoskeletal: Positive for myalgias and arthralgias.  Skin: Negative.   Allergic/Immunologic: Negative.  Negative for immunocompromised state.  Neurological: Negative.  Negative for dizziness, weakness and numbness.  Hematological: Negative.   Psychiatric/Behavioral: Negative.        Depression controlled on current medication regimen       Objective:   Physical Exam  Constitutional: She is oriented to person, place, and time. She appears well-developed and well-nourished.  HENT:  Head: Normocephalic and atraumatic.  Right Ear: External ear normal.  Left Ear: External ear normal.  Mouth/Throat: Oropharynx is clear and moist.  Eyes: Conjunctivae are normal. Pupils are equal, round, and reactive to light.  Neck: Normal range of motion. Neck supple.  Cardiovascular: Normal rate, regular rhythm, normal heart sounds and intact distal pulses.   Pulmonary/Chest: Effort normal and breath sounds normal.  Abdominal: Soft. Bowel sounds are normal.  Musculoskeletal: Normal range of motion.  Neurological: She is alert and oriented to person, place, and time. She has normal  reflexes.  Skin: Skin is warm and dry.  Psychiatric: She has a normal mood and affect. Her behavior is normal. Judgment and thought content normal.         BP 160/94 mmHg  Pulse 72  Temp(Src) 98.4 F (36.9 C) (Oral)  Resp 16  Ht 5\' 7"  (1.702 m)  Wt 192 lb (87.091 kg)  BMI 30.06 kg/m2  LMP 09/19/2015 Assessment & Plan:   1. Essential hypertension Blood pressure is above goal. Patient has not taken anti-hypertensive medications over the past several months due to loss of insurance benefits. Will re-start Losartan 100 mg daily. The patient is asked to make an attempt to improve diet and exercise patterns to aid in medical management of this problem. Will follow up in 1 month for hypertension. Reviewed urinalysis, no proteinuria present.  - losartan (COZAAR) 100 MG tablet; Take 1 tablet (100 mg total) by mouth daily. Reported on 10/08/2015  Dispense: 30 tablet; Refill: 0  2. Gastroesophageal reflux disease without esophagitis Discussed diet to prevent GERD at length. Also, discussed interventions. Patient expressed understanding. Will start dexilant 60 mg as previously prescribed. Patient given samples in office.  - dexlansoprazole (DEXILANT) 60 MG capsule; Take 1 capsule (60 mg total) by mouth daily. Reported on 10/08/2015  Dispense: 30 capsule; Refill: 0  3. Metabolic syndrome Current BMI is 30.7, abdominal girth is also increased. Recommend a lowfat, low carbohydrate diet divided over 5-6 small meals, increase water intake to 6-8 glasses, and 150 minutes per week of cardiovascular exercise.   - POCT urinalysis dipstick - COMPLETE METABOLIC PANEL WITH GFR - CBC with Differential - TSH - Hemoglobin A1c - Lipid Panel  4. Fibromyalgia Will continue Cymbalta as previously prescribed.   5. Depression Reviewed PHQ-9. Patient has discussed stressors at length. She is currently under the care of counselor, Dr. Cephus Slater for depression. She denies suicidal or homicidal ideations.   - DULoxetine (CYMBALTA) 30 MG capsule; Take 1 capsule (30 mg total) by mouth 2 (two) times daily.  Dispense: 60 capsule;     Routine Health Maintenance:  Recommend annual mammogram Recommend returning for vaccinations due to recent cold symptoms Recommend pap smear in 3 months   RTC: 2 weeks for blood pressure check and 1 month for hypertension  Shahida Schnackenberg M, FNP

## 2015-10-08 NOTE — Patient Instructions (Addendum)
Recommend a lowfat, low carbohydrate diet divided over 5-6 small meals, increase water intake to 6-8 glasses, and 150 minutes per week of cardiovascular exercise.   Patient will return to office for a blood pressure check in 2 weeks after starting Amlodipine.   Recommend increase fluid intake  DASH Eating Plan DASH stands for "Dietary Approaches to Stop Hypertension." The DASH eating plan is a healthy eating plan that has been shown to reduce high blood pressure (hypertension). Additional health benefits may include reducing the risk of type 2 diabetes mellitus, heart disease, and stroke. The DASH eating plan may also help with weight loss. WHAT DO I NEED TO KNOW ABOUT THE DASH EATING PLAN? For the DASH eating plan, you will follow these general guidelines:  Choose foods with a percent daily value for sodium of less than 5% (as listed on the food label).  Use salt-free seasonings or herbs instead of table salt or sea salt.  Check with your health care provider or pharmacist before using salt substitutes.  Eat lower-sodium products, often labeled as "lower sodium" or "no salt added."  Eat fresh foods.  Eat more vegetables, fruits, and low-fat dairy products.  Choose whole grains. Look for the word "whole" as the first word in the ingredient list.  Choose fish and skinless chicken or [place] more often than red meat. Limit fish, poultry, and meat to 6 oz (170 g) each day.  Limit sweets, desserts, sugars, and sugary drinks.  Choose heart-healthy fats.  Limit cheese to 1 oz (28 g) per day.  Eat more home-cooked food and less restaurant, buffet, and fast food.  Limit fried foods.  Cook foods using methods other than frying.  Limit canned vegetables. If you do use them, rinse them well to decrease the sodium.  When eating at a restaurant, ask that your food be prepared with less salt, or no salt if possible. WHAT FOODS CAN I EAT? Seek help from a dietitian for individual calorie  needs. Grains Whole grain or whole wheat bread. Brown rice. Whole grain or whole wheat pasta. Quinoa, bulgur, and whole grain cereals. Low-sodium cereals. Corn or whole wheat flour tortillas. Whole grain cornbread. Whole grain crackers. Low-sodium crackers. Vegetables Fresh or frozen vegetables (raw, steamed, roasted, or grilled). Low-sodium or reduced-sodium tomato and vegetable juices. Low-sodium or reduced-sodium tomato sauce and paste. Low-sodium or reduced-sodium canned vegetables.  Fruits All fresh, canned (in natural juice), or frozen fruits. Meat and Other Protein Products Ground beef (85% or leaner), grass-fed beef, or beef trimmed of fat. Skinless chicken or [place]. Ground chicken or [place]. Pork trimmed of fat. All fish and seafood. Eggs. Dried beans, peas, or lentils. Unsalted nuts and seeds. Unsalted canned beans. Dairy Low-fat dairy products, such as skim or 1% milk, 2% or reduced-fat cheeses, low-fat ricotta or cottage cheese, or plain low-fat yogurt. Low-sodium or reduced-sodium cheeses. Fats and Oils Tub margarines without trans fats. Light or reduced-fat mayonnaise and salad dressings (reduced sodium). Avocado. Safflower, olive, or canola oils. Natural peanut or almond butter. Other Unsalted popcorn and pretzels. The items listed above may not be a complete list of recommended foods or beverages. Contact your dietitian for more options. WHAT FOODS ARE NOT RECOMMENDED? Grains White bread. White pasta. White rice. Refined cornbread. Bagels and croissants. Crackers that contain trans fat. Vegetables Creamed or fried vegetables. Vegetables in a cheese sauce. Regular canned vegetables. Regular canned tomato sauce and paste. Regular tomato and vegetable juices. Fruits Dried fruits. Canned fruit in light or heavy syrup.  Fruit juice. Meat and Other Protein Products Fatty cuts of meat. Ribs, chicken wings, bacon, sausage, bologna, salami, chitterlings, fatback, hot dogs, bratwurst,  and packaged luncheon meats. Salted nuts and seeds. Canned beans with salt. Dairy Whole or 2% milk, cream, half-and-half, and cream cheese. Whole-fat or sweetened yogurt. Full-fat cheeses or blue cheese. Nondairy creamers and whipped toppings. Processed cheese, cheese spreads, or cheese curds. Condiments Onion and garlic salt, seasoned salt, table salt, and sea salt. Canned and packaged gravies. Worcestershire sauce. Tartar sauce. Barbecue sauce. Teriyaki sauce. Soy sauce, including reduced sodium. Steak sauce. Fish sauce. Oyster sauce. Cocktail sauce. Horseradish. Ketchup and mustard. Meat flavorings and tenderizers. Bouillon cubes. Hot sauce. Tabasco sauce. Marinades. Taco seasonings. Relishes. Fats and Oils Butter, stick margarine, lard, shortening, ghee, and bacon fat. Coconut, palm kernel, or palm oils. Regular salad dressings. Other Pickles and olives. Salted popcorn and pretzels. The items listed above may not be a complete list of foods and beverages to avoid. Contact your dietitian for more information. WHERE CAN I FIND MORE INFORMATION? National Heart, Lung, and Blood Institute: travelstabloid.com   This information is not intended to replace advice given to you by your health care provider. Make sure you discuss any questions you have with your health care provider.   Document Released: 09/28/2011 Document Revised: 10/30/2014 Document Reviewed: 08/13/2013 Elsevier Interactive Patient Education 2016 [place]. Gastroesophageal Reflux Disease, Adult Normally, food travels down the esophagus and stays in the stomach to be digested. If a person has gastroesophageal reflux disease (GERD), food and stomach acid move back up into the esophagus. When this happens, the esophagus becomes sore and swollen (inflamed). Over time, GERD can make small holes (ulcers) in the lining of the esophagus. HOME CARE Diet  Follow a diet as told by your doctor. You may  need to avoid foods and drinks such as:  Coffee and tea (with or without caffeine).  Drinks that contain alcohol.  Energy drinks and sports drinks.  Carbonated drinks or sodas.  Chocolate and cocoa.  Peppermint and mint flavorings.  Garlic and onions.  Horseradish.  Spicy and acidic foods, such as peppers, chili powder, curry powder, vinegar, hot sauces, and BBQ sauce.  Citrus fruit juices and citrus fruits, such as oranges, lemons, and limes.  Tomato-based foods, such as red sauce, chili, salsa, and pizza with red sauce.  Fried and fatty foods, such as donuts, french fries, potato chips, and high-fat dressings.  High-fat meats, such as hot dogs, rib eye steak, sausage, ham, and bacon.  High-fat dairy items, such as whole milk, butter, and cream cheese.  Eat small meals often. Avoid eating large meals.  Avoid drinking large amounts of liquid with your meals.  Avoid eating meals during the 2-3 hours before bedtime.  Avoid lying down right after you eat.  Do not exercise right after you eat. General Instructions  Pay attention to any changes in your symptoms.  Take over-the-counter and prescription medicines only as told by your doctor. Do not take aspirin, ibuprofen, or other NSAIDs unless your doctor says it is okay.  Do not use any tobacco products, including cigarettes, chewing tobacco, and e-cigarettes. If you need help quitting, ask your doctor.  Wear loose clothes. Do not wear anything tight around your waist.  Raise (elevate) the head of your bed about 6 inches (15 cm).  Try to lower your stress. If you need help doing this, ask your doctor.  If you are overweight, lose an amount of weight that is healthy  for you. Ask your doctor about a safe weight loss goal.  Keep all follow-up visits as told by your doctor. This is important. GET HELP IF:  You have new symptoms.  You lose weight and you do not know why it is happening.  You have trouble  swallowing, or it hurts to swallow.  You have wheezing or a cough that keeps happening.  Your symptoms do not get better with treatment.  You have a hoarse voice. GET HELP RIGHT AWAY IF:  You have pain in your arms, neck, jaw, teeth, or back.  You feel sweaty, dizzy, or light-headed.  You have chest pain or shortness of breath.  You throw up (vomit) and your throw up looks like blood or coffee grounds.  You pass out (faint).  Your poop (stool) is bloody or black.  You cannot swallow, drink, or eat.   This information is not intended to replace advice given to you by your health care provider. Make sure you discuss any questions you have with your health care provider.   Document Released: 03/27/2008 Document Revised: 06/30/2015 Document Reviewed: 02/03/2015 Elsevier Interactive Patient Education [employer].

## 2015-10-09 ENCOUNTER — Encounter: Payer: Self-pay | Admitting: Family Medicine

## 2015-10-09 DIAGNOSIS — K219 Gastro-esophageal reflux disease without esophagitis: Secondary | ICD-10-CM | POA: Insufficient documentation

## 2015-10-09 DIAGNOSIS — M797 Fibromyalgia: Secondary | ICD-10-CM | POA: Insufficient documentation

## 2015-10-09 DIAGNOSIS — F32A Depression, unspecified: Secondary | ICD-10-CM | POA: Insufficient documentation

## 2015-10-09 DIAGNOSIS — F329 Major depressive disorder, single episode, unspecified: Secondary | ICD-10-CM | POA: Insufficient documentation

## 2015-10-09 DIAGNOSIS — E8881 Metabolic syndrome: Secondary | ICD-10-CM | POA: Insufficient documentation

## 2015-10-09 DIAGNOSIS — I10 Essential (primary) hypertension: Secondary | ICD-10-CM | POA: Insufficient documentation

## 2015-10-09 LAB — HEMOGLOBIN A1C
Hgb A1c MFr Bld: 5.2 % (ref <5.7)
Mean Plasma Glucose: 103 mg/dL (ref <117)

## 2015-10-11 ENCOUNTER — Telehealth: Payer: Self-pay

## 2015-10-11 NOTE — Telephone Encounter (Signed)
Called, no answer. Left message asking patient to call back regarding labs. Thanks!

## 2015-10-11 NOTE — Telephone Encounter (Signed)
Patient called  Back and was advised of labs and recommendations. Patient states verbal understanding and will keep follow up appointment. Thanks!

## 2015-10-11 NOTE — Telephone Encounter (Signed)
-----   Message from Dorena Dew, [place] sent at 10/09/2015 11:45 AM EST ----- Please inform patient that she is mildly anemic at 11.1, range is 12-15. Recommend that patient increase green, leafy veggies, organ meats, legumes and protein sources. Will repeat in 6 months. Start medications for hypertension and GERD as discussed during appt. Will follow up for hypertension in 1 month as discussed. All other labs are within normal range. She can view lab values on My Chart.   Thanks  ----- Message -----    From: [facility] in [facility]    Sent: 10/09/2015   1:42 AM      To: Dorena Dew, FNP

## 2015-10-22 ENCOUNTER — Other Ambulatory Visit: Payer: BC Managed Care – PPO

## 2015-10-22 ENCOUNTER — Telehealth: Payer: Self-pay | Admitting: Family Medicine

## 2015-10-22 ENCOUNTER — Other Ambulatory Visit: Payer: Self-pay | Admitting: Family Medicine

## 2015-10-22 ENCOUNTER — Other Ambulatory Visit: Payer: Self-pay

## 2015-10-22 ENCOUNTER — Telehealth: Payer: Self-pay

## 2015-10-22 DIAGNOSIS — K219 Gastro-esophageal reflux disease without esophagitis: Secondary | ICD-10-CM

## 2015-10-22 MED ORDER — OMEPRAZOLE 20 MG PO CPDR: 20.0000 mg | DELAYED_RELEASE_CAPSULE | Freq: Every day | ORAL | Status: DC

## 2015-10-22 NOTE — Telephone Encounter (Signed)
[place], this was the replacement for dexilant. Patient states it gives her headaches. Please advise. Thanks!

## 2015-10-22 NOTE — Telephone Encounter (Signed)
Patient called to state Prilosec gives her headaches.

## 2015-10-22 NOTE — Telephone Encounter (Signed)
Patient is stating her dexilant is making her nauseous is there anything thing else that she could take? Please advise. Thanks!

## 2015-11-03 MED FILL — ?OMEPRAZOLE DR 20 MG CAPSUL: 20 | 30 days supply | Qty: 30 | Fill #0

## 2015-11-11 ENCOUNTER — Ambulatory Visit (INDEPENDENT_AMBULATORY_CARE_PROVIDER_SITE_OTHER): Payer: Self-pay | Admitting: Family Medicine

## 2015-11-11 VITALS — BP 164/102 | HR 70 | Temp 97.9°F | Resp 16 | Ht 67.0 in | Wt 191.0 lb

## 2015-11-11 DIAGNOSIS — Z23 Encounter for immunization: Secondary | ICD-10-CM

## 2015-11-11 DIAGNOSIS — I10 Essential (primary) hypertension: Secondary | ICD-10-CM

## 2015-11-11 DIAGNOSIS — D649 Anemia, unspecified: Secondary | ICD-10-CM

## 2015-11-11 DIAGNOSIS — F329 Major depressive disorder, single episode, unspecified: Secondary | ICD-10-CM

## 2015-11-11 DIAGNOSIS — F32A Depression, unspecified: Secondary | ICD-10-CM

## 2015-11-11 DIAGNOSIS — K219 Gastro-esophageal reflux disease without esophagitis: Secondary | ICD-10-CM

## 2015-11-11 LAB — POCT URINALYSIS DIP (DEVICE)
Bilirubin Urine: NEGATIVE
Glucose, UA: NEGATIVE mg/dL
KETONES UR: NEGATIVE mg/dL
Leukocytes, UA: NEGATIVE
Nitrite: NEGATIVE
PROTEIN: NEGATIVE mg/dL
SPECIFIC GRAVITY, URINE: 1.025 (ref 1.005–1.030)
UROBILINOGEN UA: 0.2 mg/dL (ref 0.0–1.0)
pH: 6 (ref 5.0–8.0)

## 2015-11-11 MED ORDER — AMLODIPINE BESYLATE 5 MG PO TABS: 5.0000 mg | ORAL_TABLET | Freq: Every day | ORAL | Status: DC

## 2015-11-11 MED FILL — ?AMLODIPINE BESYLATE 5 MG T: 5 | 30 days supply | Qty: 30 | Fill #0

## 2015-11-11 NOTE — Progress Notes (Signed)
Subjective:    Patient ID: Victoria Curry, female    DOB: Mar 08, 1966, 50 y.o.   MRN: [id number]  HPI [name], a 50 year old female with a history of hypertension, GERD, presents for a 1 month follow-up. She was restarted on medications for hypertension and GERD 1 month ago. She maintains that she has been taking medications consistently. Symptoms of GERD have improved. However, she maintains that she gets a headache after taking medication. She has improved overall diet and symptoms have decreased. She also has a history of hypertension. She states that she has decreased sodium intake. She states that she is very active, but has not started an exercise regimen. She does not check blood pressure at home.   Patient denies chest pain, dyspnea, fatigue, lower extremity edema, palpitations and tachypnea.   Patient also has a history of depression.  She complains of periodic depressed mood and fatigue. Symptoms are mostly controlled on current medication regimen.   She denies current suicidal and homicidal plan or intent. Symptoms are currently controlled on Cymbalta twice daily Past Medical History  Diagnosis Date  . Fibromyalgia   . Hypertension   . Depression   . GERD (gastroesophageal reflux disease)    Social History   Social History  . Marital Status: Married    Spouse Name: N/A  . Number of Children: N/A  . Years of Education: N/A   Occupational History  . Not on file.   Social History Main Topics  . Smoking status: Former Research scientist (life sciences)  . Smokeless tobacco: Not on file  . Alcohol Use: No  . Drug Use: No  . Sexual Activity: Not on file   Other Topics Concern  . Not on file   Social History Narrative   Allergies  Allergen Reactions  . Adhesive [Tape] Other (See Comments)    Skin irritation   . Codeine Itching  . Latex Other (See Comments)    Skin irritation   . Shellfish Allergy     Review of Systems  Constitutional: Positive for fatigue. Negative for fever,  activity change, appetite change and unexpected weight change.  HENT: Negative.   Eyes: Negative.  Negative for visual disturbance.  Respiratory: Negative.  Negative for apnea, chest tightness and shortness of breath.   Cardiovascular: Negative.   Gastrointestinal: Negative.  Negative for nausea, abdominal pain, diarrhea, constipation and blood in stool.       History of heartburn  Endocrine: Negative.  Negative for polydipsia, polyphagia and polyuria.  Musculoskeletal: Positive for myalgias and arthralgias.  Skin: Negative.   Allergic/Immunologic: Negative.  Negative for immunocompromised state.  Neurological: Negative.  Negative for dizziness, weakness and numbness.  Hematological: Negative.   Psychiatric/Behavioral: Negative.        Depression controlled on current medication regimen       Objective:   Physical Exam  Constitutional: She is oriented to person, place, and time. She appears well-developed and well-nourished.  HENT:  Head: Normocephalic and atraumatic.  Right Ear: External ear normal.  Left Ear: External ear normal.  Mouth/Throat: Oropharynx is clear and moist.  Eyes: Conjunctivae are normal. Pupils are equal, round, and reactive to light.  Neck: Normal range of motion. Neck supple.  Cardiovascular: Normal rate, regular rhythm, normal heart sounds and intact distal pulses.   Pulmonary/Chest: Effort normal and breath sounds normal.  Abdominal: Soft. Bowel sounds are normal.  Musculoskeletal: Normal range of motion.  Neurological: She is alert and oriented to person, place, and time. She has  normal reflexes.  Skin: Skin is warm and dry.  Psychiatric: She has a normal mood and affect. Her behavior is normal. Judgment and thought content normal.         BP 164/102 mmHg  Pulse 70  Temp(Src) 97.9 F (36.6 C) (Oral)  Resp 16  Ht 5\' 7"  (1.702 m)  Wt 191 lb (86.637 kg)  BMI 29.91 kg/m2  LMP 10/09/2015 Assessment & Plan:  1. Essential hypertension Blood  pressure is not at goal on current medication regimen. Will start a trial of Amlodipine 5 mg daily. Will follow up in 1 month. Reviewed urinalysis, no proteinuria present.  - amLODipine (NORVASC) 5 MG tablet; Take 1 tablet (5 mg total) by mouth daily.  Dispense: 30 tablet; Refill: 0 - POCT urinalysis dipstick  2. Gastroesophageal reflux disease without esophagitis Patient reports headache after taking Omeprazole. Advised to stop medication. Continue balanced diet. Avoid foods that promote GERD, patient was given list of foods previously.   3. Depression Will continue Cymbalta as previously prescribed. Denies suicidal or homicidal ideations.  She is currently under the care of counselor, Dr. Cephus Slater for depression. She denies suicidal or homicidal ideations.  - DULoxetine (CYMBALTA) 30 MG capsule; Take 1 capsule (30 mg total) by mouth 2 (two) times daily.  Dispense: 60 capsule;  4. Anemia, unspecified anemia type Will continue ferrous fumarate daily.   5. Need for prophylactic vaccination and inoculation against influenza - Flu Vaccine QUAD 36+ mos PF IM (Fluarix & Fluzone Quad PF)    RTC: 1 month for hypertension    Cathlyn Tersigni M, FNP   The patient was given clear instructions to go to ER or return to medical center if symptoms do not improve, worsen or new problems develop. The patient verbalized understanding. Will notify patient with laboratory results.

## 2015-11-11 NOTE — Patient Instructions (Addendum)
Will start a trial of Amlodipine 5 mg daily for blood pressure. Return in 1 week for blood pressure check.  Return in 1 month for hypertension  DASH Eating Plan DASH stands for "Dietary Approaches to Stop Hypertension." The DASH eating plan is a healthy eating plan that has been shown to reduce high blood pressure (hypertension). Additional health benefits may include reducing the risk of type 2 diabetes mellitus, heart disease, and stroke. The DASH eating plan may also help with weight loss. WHAT DO I NEED TO KNOW ABOUT THE DASH EATING PLAN? For the DASH eating plan, you will follow these general guidelines:  Choose foods with a percent daily value for sodium of less than 5% (as listed on the food label).  Use salt-free seasonings or herbs instead of table salt or sea salt.  Check with your health care provider or pharmacist before using salt substitutes.  Eat lower-sodium products, often labeled as "lower sodium" or "no salt added."  Eat fresh foods.  Eat more vegetables, fruits, and low-fat dairy products.  Choose whole grains. Look for the word "whole" as the first word in the ingredient list.  Choose fish and skinless chicken or [place] more often than red meat. Limit fish, poultry, and meat to 6 oz (170 g) each day.  Limit sweets, desserts, sugars, and sugary drinks.  Choose heart-healthy fats.  Limit cheese to 1 oz (28 g) per day.  Eat more home-cooked food and less restaurant, buffet, and fast food.  Limit fried foods.  Cook foods using methods other than frying.  Limit canned vegetables. If you do use them, rinse them well to decrease the sodium.  When eating at a restaurant, ask that your food be prepared with less salt, or no salt if possible. WHAT FOODS CAN I EAT? Seek help from a dietitian for individual calorie needs. Grains Whole grain or whole wheat bread. Brown rice. Whole grain or whole wheat pasta. Quinoa, bulgur, and whole grain cereals. Low-sodium  cereals. Corn or whole wheat flour tortillas. Whole grain cornbread. Whole grain crackers. Low-sodium crackers. Vegetables Fresh or frozen vegetables (raw, steamed, roasted, or grilled). Low-sodium or reduced-sodium tomato and vegetable juices. Low-sodium or reduced-sodium tomato sauce and paste. Low-sodium or reduced-sodium canned vegetables.  Fruits All fresh, canned (in natural juice), or frozen fruits. Meat and Other Protein Products Ground beef (85% or leaner), grass-fed beef, or beef trimmed of fat. Skinless chicken or [place]. Ground chicken or [place]. Pork trimmed of fat. All fish and seafood. Eggs. Dried beans, peas, or lentils. Unsalted nuts and seeds. Unsalted canned beans. Dairy Low-fat dairy products, such as skim or 1% milk, 2% or reduced-fat cheeses, low-fat ricotta or cottage cheese, or plain low-fat yogurt. Low-sodium or reduced-sodium cheeses. Fats and Oils Tub margarines without trans fats. Light or reduced-fat mayonnaise and salad dressings (reduced sodium). Avocado. Safflower, olive, or canola oils. Natural peanut or almond butter. Other Unsalted popcorn and pretzels. The items listed above may not be a complete list of recommended foods or beverages. Contact your dietitian for more options. WHAT FOODS ARE NOT RECOMMENDED? Grains White bread. White pasta. White rice. Refined cornbread. Bagels and croissants. Crackers that contain trans fat. Vegetables Creamed or fried vegetables. Vegetables in a cheese sauce. Regular canned vegetables. Regular canned tomato sauce and paste. Regular tomato and vegetable juices. Fruits Dried fruits. Canned fruit in light or heavy syrup. Fruit juice. Meat and Other Protein Products Fatty cuts of meat. Ribs, chicken wings, bacon, sausage, bologna, salami, chitterlings, fatback, hot dogs,  bratwurst, and packaged luncheon meats. Salted nuts and seeds. Canned beans with salt. Dairy Whole or 2% milk, cream, half-and-half, and cream cheese.  Whole-fat or sweetened yogurt. Full-fat cheeses or blue cheese. Nondairy creamers and whipped toppings. Processed cheese, cheese spreads, or cheese curds. Condiments Onion and garlic salt, seasoned salt, table salt, and sea salt. Canned and packaged gravies. Worcestershire sauce. Tartar sauce. Barbecue sauce. Teriyaki sauce. Soy sauce, including reduced sodium. Steak sauce. Fish sauce. Oyster sauce. Cocktail sauce. Horseradish. Ketchup and mustard. Meat flavorings and tenderizers. Bouillon cubes. Hot sauce. Tabasco sauce. Marinades. Taco seasonings. Relishes. Fats and Oils Butter, stick margarine, lard, shortening, ghee, and bacon fat. Coconut, palm kernel, or palm oils. Regular salad dressings. Other Pickles and olives. Salted popcorn and pretzels. The items listed above may not be a complete list of foods and beverages to avoid. Contact your dietitian for more information. WHERE CAN I FIND MORE INFORMATION? National Heart, [employer], [employer] Institute: travelstabloid.com   This information is not intended to replace advice given to you by your health care provider. Make sure you discuss any questions you have with your health care provider.   Document Released: 09/28/2011 Document Revised: 10/30/2014 Document Reviewed: 08/13/2013 Elsevier Interactive Patient Education [employer].

## 2015-11-13 ENCOUNTER — Encounter: Payer: Self-pay | Admitting: Family Medicine

## 2015-11-13 DIAGNOSIS — D649 Anemia, unspecified: Secondary | ICD-10-CM | POA: Insufficient documentation

## 2015-11-19 ENCOUNTER — Other Ambulatory Visit: Payer: Self-pay

## 2015-11-22 ENCOUNTER — Ambulatory Visit: Payer: BC Managed Care – PPO | Admitting: Family Medicine

## 2015-11-23 ENCOUNTER — Encounter: Payer: Self-pay | Admitting: Family Medicine

## 2015-11-23 NOTE — Progress Notes (Signed)
This encounter was created in error - please disregard.

## 2015-12-15 ENCOUNTER — Other Ambulatory Visit: Payer: Self-pay | Admitting: Family Medicine

## 2015-12-23 ENCOUNTER — Other Ambulatory Visit: Payer: Self-pay | Admitting: Family Medicine

## 2015-12-23 MED FILL — ?OMEPRAZOLE DR 20 MG CAPSUL: 20 | 30 days supply | Qty: 30 | Fill #0

## 2015-12-23 MED FILL — DULoxetine HCL 30 MG CPEP: 30 | 30 days supply | Qty: 60 | Fill #1

## 2015-12-24 ENCOUNTER — Encounter: Payer: Self-pay | Admitting: Family Medicine

## 2015-12-24 ENCOUNTER — Ambulatory Visit (INDEPENDENT_AMBULATORY_CARE_PROVIDER_SITE_OTHER): Payer: No Typology Code available for payment source | Admitting: Family Medicine

## 2015-12-24 VITALS — BP 141/94 | HR 104 | Temp 98.4°F | Resp 18 | Ht 67.0 in | Wt 190.0 lb

## 2015-12-24 DIAGNOSIS — I10 Essential (primary) hypertension: Secondary | ICD-10-CM

## 2015-12-24 DIAGNOSIS — N63 Unspecified lump in breast: Secondary | ICD-10-CM

## 2015-12-24 DIAGNOSIS — Z803 Family history of malignant neoplasm of breast: Secondary | ICD-10-CM

## 2015-12-24 DIAGNOSIS — N6310 Unspecified lump in the right breast, unspecified quadrant: Secondary | ICD-10-CM

## 2015-12-24 DIAGNOSIS — N6320 Unspecified lump in the left breast, unspecified quadrant: Secondary | ICD-10-CM

## 2015-12-24 LAB — COMPLETE METABOLIC PANEL WITH GFR
ALT: 14 U/L (ref 6–29)
AST: 17 U/L (ref 10–35)
Albumin: 3.8 g/dL (ref 3.6–5.1)
Alkaline Phosphatase: 58 U/L (ref 33–115)
BUN: 14 mg/dL (ref 7–25)
CHLORIDE: 105 mmol/L (ref 98–110)
CO2: 27 mmol/L (ref 20–31)
Calcium: 9.1 mg/dL (ref 8.6–10.2)
Creat: 0.86 mg/dL (ref 0.50–1.10)
GFR, EST NON AFRICAN AMERICAN: 80 mL/min (ref >=60)
GLUCOSE: 87 mg/dL (ref 65–99)
POTASSIUM: 4.1 mmol/L (ref 3.5–5.3)
SODIUM: 139 mmol/L (ref 135–146)
Total Bilirubin: 0.3 mg/dL (ref 0.2–1.2)
Total Protein: 6.7 g/dL (ref 6.1–8.1)

## 2015-12-24 LAB — POCT URINALYSIS DIP (DEVICE)
BILIRUBIN URINE: NEGATIVE
Glucose, UA: NEGATIVE mg/dL
KETONES UR: NEGATIVE mg/dL
LEUKOCYTES UA: NEGATIVE
Nitrite: NEGATIVE
Protein, ur: NEGATIVE mg/dL
Urobilinogen, UA: 0.2 mg/dL (ref 0.0–1.0)
pH: 6 (ref 5.0–8.0)

## 2015-12-24 LAB — CBC WITH DIFFERENTIAL/PLATELET
BASOS PCT: 0 % (ref 0–1)
Basophils Absolute: 0 10*3/uL (ref 0.0–0.1)
EOS PCT: 1 % (ref 0–5)
Eosinophils Absolute: 0.1 10*3/uL (ref 0.0–0.7)
HEMATOCRIT: 37.1 % (ref 36.0–46.0)
Hemoglobin: 12 g/dL (ref 12.0–15.0)
Lymphocytes Relative: 29 % (ref 12–46)
Lymphs Abs: 2.4 10*3/uL (ref 0.7–4.0)
MCH: 30.8 pg (ref 26.0–34.0)
MCHC: 32.3 g/dL (ref 30.0–36.0)
MCV: 95.1 fL (ref 78.0–100.0)
MONO ABS: 0.7 10*3/uL (ref 0.1–1.0)
MPV: 9.8 fL (ref 8.6–12.4)
Monocytes Relative: 9 % (ref 3–12)
Neutro Abs: 5.1 10*3/uL (ref 1.7–7.7)
Neutrophils Relative %: 61 % (ref 43–77)
PLATELETS: 322 10*3/uL (ref 150–400)
RBC: 3.9 MIL/uL (ref 3.87–5.11)
RDW: 13.8 % (ref 11.5–15.5)
WBC: 8.3 10*3/uL (ref 4.0–10.5)

## 2015-12-24 MED ORDER — AMLODIPINE BESYLATE 5 MG PO TABS: 5.0000 mg | ORAL_TABLET | Freq: Every day | ORAL | Status: DC

## 2015-12-24 MED ORDER — LOSARTAN POTASSIUM 100 MG PO TABS: 100.0000 mg | ORAL_TABLET | Freq: Every day | ORAL | Status: DC

## 2015-12-24 NOTE — Progress Notes (Signed)
Subjective:    Patient ID: Victoria Curry, female    DOB: 29-Jan-1966, 50 y.o.   MRN: [id number]  HPI Victoria Curry, a 50 year old female with history of hypertension presents for a 1 month follow up after adding additional medication.  She is not exercising and is adherent to low salt diet.  Blood pressure is well controlled at home.Patient denies chest pain, dyspnea, fatigue, lower extremity edema, orthopnea, palpitations, syncope and tachypnea.  Cardiovascular risk factors include: obesity (BMI >= 30 kg/m2).   Patient also presents for evaluation of a breast mass. Change was noted several weeks ago. Patient routinely does self breast exams.  Patient noted a change on breast exam. Breast cancer risk factors include first degree relative diagnosed with breast cancer. Patient denies hormonal therapy.  Patient breast feed 4 children. Patient denies nipple discharge. Patient reports a previous breast biopsy to left breast 25 years ago. Patient endorses a family history of breast cancer.  Past Medical History  Diagnosis Date  . Fibromyalgia   . Hypertension   . Depression   . GERD (gastroesophageal reflux disease)    Past Surgical History  Procedure Laterality Date  . Breast lumpectomy     Immunization History  Administered Date(s) Administered  . Influenza,inj,Quad PF,36+ Mos 11/11/2015   Allergies  Allergen Reactions  . Adhesive [Tape] Other (See Comments)    Skin irritation   . Codeine Itching  . Latex Other (See Comments)    Skin irritation   . Shellfish Allergy    Social History   Social History  . Marital Status: Married    Spouse Name: N/A  . Number of Children: N/A  . Years of Education: N/A   Occupational History  . Not on file.   Social History Main Topics  . Smoking status: Former Research scientist (life sciences)  . Smokeless tobacco: Not on file  . Alcohol Use: No  . Drug Use: No  . Sexual Activity: Yes   Other Topics Concern  . Not on file   Social History Narrative    Review of Systems  Constitutional: Negative.   HENT: Negative.   Eyes: Negative.   Respiratory: Negative.   Cardiovascular: Negative.  Negative for chest pain, palpitations and leg swelling.  Gastrointestinal: Negative.  Negative for nausea.  Endocrine: Negative.  Negative for polydipsia, polyphagia and polyuria.  Genitourinary: Negative.   Musculoskeletal: Negative.        Breast tenderness and breast mass  Skin: Negative.   Allergic/Immunologic: Negative.   Neurological: Negative.   Hematological: Negative.   Psychiatric/Behavioral: Negative.        Objective:   Physical Exam  Constitutional: She is oriented to person, place, and time. She appears well-developed and well-nourished.  HENT:  Head: Normocephalic and atraumatic.  Right Ear: External ear normal.  Left Ear: External ear normal.  Mouth/Throat: Oropharynx is clear and moist.  Eyes: Conjunctivae and EOM are normal. Pupils are equal, round, and reactive to light.  Neck: Normal range of motion. Neck supple.  Cardiovascular: Normal rate, regular rhythm, normal heart sounds and intact distal pulses.   Pulmonary/Chest: Effort normal and breath sounds normal. Right breast exhibits mass and tenderness. Left breast exhibits mass and tenderness. Left breast exhibits no nipple discharge. Breasts are asymmetrical.  Right lower outer-Round, raised, moveable, tender to palpation, no discoloration Right lower inner- round, non tender to palpation  Left lower outer-Tender to touch, round, raised movable Left nipple-scaly, rough to touch  Abdominal: Soft. Bowel sounds are normal.  Neurological: She is alert and oriented to person, place, and time. She has normal reflexes.  Skin: Skin is warm and dry.  Psychiatric: She has a normal mood and affect. Her behavior is normal. Judgment and thought content normal.     BP 141/94 mmHg  Pulse 104  Temp(Src) 98.4 F (36.9 C) (Oral)  Resp 18  Ht 5\' 7"  (1.702 m)  Wt 190 lb (86.183 kg)   BMI 29.75 kg/m2  SpO2 100%  LMP 12/12/2015 Assessment & Plan:  1. Essential hypertension Blood pressure is at goal on current medication  - amLODipine (NORVASC) 5 MG tablet; Take 1 tablet (5 mg total) by mouth daily.  Dispense: 30 tablet; Refill: 5 - losartan (COZAAR) 100 MG tablet; Take 1 tablet (100 mg total) by mouth daily. Reported on 10/08/2015  Dispense: 30 tablet; Refill: 5 - POCT urinalysis dipstick - COMPLETE METABOLIC PANEL WITH GFR - [facility]; Future  2. Masses of both breasts - Prolactin - CBC with Differential - [facility]; Future  3. Family history of breast cancer in first degree relative  - [facility]; Future   RTC: Will follow up in 3 months for hypertension. Will follow up by phone with laboratory results.   The patient was given clear instructions to go to ER or return to medical center if symptoms do not improve, worsen or new problems develop. The patient verbalized understanding. Will notify patient with laboratory results.   Dorena Dew, FNP

## 2015-12-24 NOTE — Patient Instructions (Signed)
Breast Self-Awareness Practicing breast self-awareness may pick up problems early, prevent significant medical complications, and possibly save your life. By practicing breast self-awareness, you can become familiar with how your breasts look and feel and if your breasts are changing. This allows you to notice changes early. It can also offer you some reassurance that your breast health is good. One way to learn what is normal for your breasts and whether your breasts are changing is to do a breast self-exam. If you find a lump or something that was not present in the past, it is best to contact your caregiver right away. Other findings that should be evaluated by your caregiver include nipple discharge, especially if it is bloody; skin changes or reddening; areas where the skin seems to be pulled in (retracted); or new lumps and bumps. Breast pain is seldom associated with cancer (malignancy), but should also be evaluated by a caregiver. HOW TO PERFORM A BREAST SELF-EXAM The best time to examine your breasts is 5-7 days after your menstrual period is over. During menstruation, the breasts are lumpier, and it may be more difficult to pick up changes. If you do not menstruate, have reached menopause, or had your uterus removed (hysterectomy), you should examine your breasts at regular intervals, such as monthly. If you are breastfeeding, examine your breasts after a feeding or after using a breast pump. Breast implants do not decrease the risk for lumps or tumors, so continue to perform breast self-exams as recommended. Talk to your caregiver about how to determine the difference between the implant and breast tissue. Also, talk about the amount of pressure you should use during the exam. Over time, you will become more familiar with the variations of your breasts and more comfortable with the exam. A breast self-exam requires you to remove all your clothes above the waist. 1. Look at your breasts and nipples.  Stand in front of a mirror in a room with good lighting. With your hands on your hips, push your hands firmly downward. Look for a difference in shape, contour, and size from one breast to the other (asymmetry). Asymmetry includes puckers, dips, or bumps. Also, look for skin changes, such as reddened or scaly areas on the breasts. Look for nipple changes, such as discharge, dimpling, repositioning, or redness. 2. Carefully feel your breasts. This is best done either in the shower or tub while using soapy water or when flat on your back. Place the arm (on the side of the breast you are examining) above your head. Use the pads (not the fingertips) of your three middle fingers on your opposite hand to feel your breasts. Start in the underarm area and use  inch (2 cm) overlapping circles to feel your breast. Use 3 different levels of pressure (light, medium, and firm pressure) at each circle before moving to the next circle. The light pressure is needed to feel the tissue closest to the skin. The medium pressure will help to feel breast tissue a little deeper, while the firm pressure is needed to feel the tissue close to the ribs. Continue the overlapping circles, moving downward over the breast until you feel your ribs below your breast. Then, move one finger-width towards the center of the body. Continue to use the  inch (2 cm) overlapping circles to feel your breast as you move slowly up toward the collar bone (clavicle) near the base of the neck. Continue the up and down exam using all 3 pressures until you reach the   middle of the chest. Do this with each breast, carefully feeling for lumps or changes. 3.  Keep a written record with breast changes or normal findings for each breast. By writing this information down, you do not need to depend only on memory for size, tenderness, or location. Write down where you are in your menstrual cycle, if you are still menstruating. Breast tissue can have some lumps or  thick tissue. However, see your caregiver if you find anything that concerns you.  SEEK MEDICAL CARE IF:  You see a change in shape, contour, or size of your breasts or nipples.   You see skin changes, such as reddened or scaly areas on the breasts or nipples.   You have an unusual discharge from your nipples.   You feel a new lump or unusually thick areas.    This information is not intended to replace advice given to you by your health care provider. Make sure you discuss any questions you have with your health care provider.   Document Released: 10/09/2005 Document Revised: 09/25/2012 Document Reviewed: 01/24/2012 Elsevier Interactive Patient Education 2016 [place] Practicing breast self-awareness may pick up problems early, prevent significant medical complications, and possibly save your life. By practicing breast self-awareness, you can become familiar with how your breasts look and feel and if your breasts are changing. This allows you to notice changes early. It can also offer you some reassurance that your breast health is good. One way to learn what is normal for your breasts and whether your breasts are changing is to do a breast self-exam. If you find a lump or something that was not present in the past, it is best to contact your caregiver right away. Other findings that should be evaluated by your caregiver include nipple discharge, especially if it is bloody; skin changes or reddening; areas where the skin seems to be pulled in (retracted); or new lumps and bumps. Breast pain is seldom associated with cancer (malignancy), but should also be evaluated by a caregiver. HOW TO PERFORM A BREAST SELF-EXAM The best time to examine your breasts is 5-7 days after your menstrual period is over. During menstruation, the breasts are lumpier, and it may be more difficult to pick up changes. If you do not menstruate, have reached menopause, or had your uterus  removed (hysterectomy), you should examine your breasts at regular intervals, such as monthly. If you are breastfeeding, examine your breasts after a feeding or after using a breast pump. Breast implants do not decrease the risk for lumps or tumors, so continue to perform breast self-exams as recommended. Talk to your caregiver about how to determine the difference between the implant and breast tissue. Also, talk about the amount of pressure you should use during the exam. Over time, you will become more familiar with the variations of your breasts and more comfortable with the exam. A breast self-exam requires you to remove all your clothes above the waist. 4. Look at your breasts and nipples. Stand in front of a mirror in a room with good lighting. With your hands on your hips, push your hands firmly downward. Look for a difference in shape, contour, and size from one breast to the other (asymmetry). Asymmetry includes puckers, dips, or bumps. Also, look for skin changes, such as reddened or scaly areas on the breasts. Look for nipple changes, such as discharge, dimpling, repositioning, or redness. 5. Carefully feel your breasts. This is best done either in the shower or  tub while using soapy water or when flat on your back. Place the arm (on the side of the breast you are examining) above your head. Use the pads (not the fingertips) of your three middle fingers on your opposite hand to feel your breasts. Start in the underarm area and use  inch (2 cm) overlapping circles to feel your breast. Use 3 different levels of pressure (light, medium, and firm pressure) at each circle before moving to the next circle. The light pressure is needed to feel the tissue closest to the skin. The medium pressure will help to feel breast tissue a little deeper, while the firm pressure is needed to feel the tissue close to the ribs. Continue the overlapping circles, moving downward over the breast until you feel your ribs  below your breast. Then, move one finger-width towards the center of the body. Continue to use the  inch (2 cm) overlapping circles to feel your breast as you move slowly up toward the collar bone (clavicle) near the base of the neck. Continue the up and down exam using all 3 pressures until you reach the middle of the chest. Do this with each breast, carefully feeling for lumps or changes. 6.  Keep a written record with breast changes or normal findings for each breast. By writing this information down, you do not need to depend only on memory for size, tenderness, or location. Write down where you are in your menstrual cycle, if you are still menstruating. Breast tissue can have some lumps or thick tissue. However, see your caregiver if you find anything that concerns you.  SEEK MEDICAL CARE IF:  You see a change in shape, contour, or size of your breasts or nipples.   You see skin changes, such as reddened or scaly areas on the breasts or nipples.   You have an unusual discharge from your nipples.   You feel a new lump or unusually thick areas.    This information is not intended to replace advice given to you by your health care provider. Make sure you discuss any questions you have with your health care provider.   Document Released: 10/09/2005 Document Revised: 09/25/2012 Document Reviewed: 01/24/2012 Elsevier Interactive Patient Education [employer].

## 2015-12-25 LAB — PROLACTIN: PROLACTIN: 18.2 ng/mL

## 2015-12-28 ENCOUNTER — Other Ambulatory Visit: Payer: Self-pay | Admitting: Family Medicine

## 2015-12-28 DIAGNOSIS — N6320 Unspecified lump in the left breast, unspecified quadrant: Principal | ICD-10-CM

## 2015-12-28 DIAGNOSIS — Z803 Family history of malignant neoplasm of breast: Secondary | ICD-10-CM

## 2015-12-28 DIAGNOSIS — N6310 Unspecified lump in the right breast, unspecified quadrant: Secondary | ICD-10-CM

## 2015-12-29 ENCOUNTER — Other Ambulatory Visit: Payer: Self-pay | Admitting: Family Medicine

## 2015-12-29 DIAGNOSIS — N6320 Unspecified lump in the left breast, unspecified quadrant: Principal | ICD-10-CM

## 2015-12-29 DIAGNOSIS — N6310 Unspecified lump in the right breast, unspecified quadrant: Secondary | ICD-10-CM

## 2016-01-10 ENCOUNTER — Other Ambulatory Visit: Payer: No Typology Code available for payment source

## 2016-01-21 ENCOUNTER — Ambulatory Visit
Admission: RE | Admit: 2016-01-21 | Discharge: 2016-01-21 | Disposition: A | Discharge status: Home / Self Care | Payer: No Typology Code available for payment source | Source: Ambulatory Visit | Attending: Family Medicine | Admitting: Family Medicine

## 2016-01-21 DIAGNOSIS — N6320 Unspecified lump in the left breast, unspecified quadrant: Principal | ICD-10-CM

## 2016-01-21 DIAGNOSIS — Z803 Family history of malignant neoplasm of breast: Secondary | ICD-10-CM

## 2016-01-21 DIAGNOSIS — N6310 Unspecified lump in the right breast, unspecified quadrant: Secondary | ICD-10-CM

## 2016-02-10 MED FILL — LOSARTAN POTASSIUM 100 MG T: 100 | 30 days supply | Qty: 30 | Fill #0

## 2016-02-10 MED FILL — ?AMLODIPINE BESYLATE 5 MG T: 5 | 30 days supply | Qty: 30 | Fill #0

## 2016-02-29 MED FILL — DULoxetine HCL 30 MG CPEP: 30 | 30 days supply | Qty: 60 | Fill #2

## 2016-03-27 ENCOUNTER — Ambulatory Visit: Payer: No Typology Code available for payment source | Admitting: Family Medicine

## 2016-03-27 MED FILL — LOSARTAN POTASSIUM 100 MG T: 100 | 30 days supply | Qty: 30 | Fill #1

## 2016-03-27 MED FILL — ?AMLODIPINE BESYLATE 5 MG T: 5 | 30 days supply | Qty: 30 | Fill #1

## 2016-04-13 MED FILL — DULoxetine HCL 30 MG CPEP: 30 | 30 days supply | Qty: 60 | Fill #3

## 2016-04-26 MED FILL — ?OMEPRAZOLE DR 20 MG CAPSUL: 20 | 30 days supply | Qty: 30 | Fill #1

## 2016-05-04 MED FILL — ?AMLODIPINE BESYLATE 5 MG T: 5 | 30 days supply | Qty: 30 | Fill #2

## 2016-05-04 MED FILL — LOSARTAN POTASSIUM 100 MG T: 100 | 30 days supply | Qty: 30 | Fill #2

## 2016-05-05 ENCOUNTER — Ambulatory Visit (INDEPENDENT_AMBULATORY_CARE_PROVIDER_SITE_OTHER): Payer: Self-pay | Admitting: Family Medicine

## 2016-05-05 VITALS — BP 118/72 | HR 83 | Temp 97.9°F | Resp 16 | Ht 67.0 in | Wt 187.0 lb

## 2016-05-05 DIAGNOSIS — I10 Essential (primary) hypertension: Secondary | ICD-10-CM

## 2016-05-05 DIAGNOSIS — F329 Major depressive disorder, single episode, unspecified: Secondary | ICD-10-CM

## 2016-05-05 DIAGNOSIS — F32A Depression, unspecified: Secondary | ICD-10-CM

## 2016-05-05 DIAGNOSIS — N898 Other specified noninflammatory disorders of vagina: Secondary | ICD-10-CM

## 2016-05-05 DIAGNOSIS — L298 Other pruritus: Secondary | ICD-10-CM

## 2016-05-05 MED ORDER — BUPROPION HCL ER (SR) 150 MG PO TB12: 150.0000 mg | ORAL_TABLET | Freq: Two times a day (BID) | ORAL | Status: DC

## 2016-05-05 MED ORDER — DULOXETINE HCL 30 MG PO CPEP: 30.0000 mg | ORAL_CAPSULE | Freq: Two times a day (BID) | ORAL | Status: DC

## 2016-05-05 NOTE — Progress Notes (Signed)
Patient ID: Victoria Curry, female   DOB: [birth year], 50 y.o.   MRN: [id number]   Victoria Curry, is a 50 y.o. female  [id number]  [id number]  DOB - 16-Jul-1966  CC:  Chief Complaint  Patient presents with  . Hypertension  . Hot Flashes  . Depression    wants to know if ani-anxiety medication that is non drowsy   . Vaginal Itching       HPI: Victoria Curry is a 50 y.o. female here for follow-up hypertension and  Depression with anxiety. She also complains today of vaginal itching for a few days (worse in the morning). She is on amlodipine and Cozaar for hypertension and does not need a refill. She is on cymbaltal 30 bid and would like something to augment this. She follows a low salt diet but does not exercise regularly. Her BP today is 118/72. She has a breast lump they are following and she has a repeat mammogram in February. She does need a PAP in the near future.  Allergies  Allergen Reactions  . Adhesive [Tape] Other (See Comments)    Skin irritation   . Codeine Itching  . Latex Other (See Comments)    Skin irritation   . Shellfish Allergy    Past Medical History  Diagnosis Date  . Fibromyalgia   . Hypertension   . Depression   . GERD (gastroesophageal reflux disease)    Current Outpatient Prescriptions on File Prior to Visit  Medication Sig Dispense Refill  . amLODipine (NORVASC) 5 MG tablet Take 1 tablet (5 mg total) by mouth daily. 30 tablet 5  . ferrous fumarate (HEMOCYTE - 106 MG FE) 325 (106 Fe) MG TABS tablet Take 1 tablet by mouth.    . losartan (COZAAR) 100 MG tablet Take 1 tablet (100 mg total) by mouth daily. Reported on 10/08/2015 30 tablet 5  . omeprazole (PRILOSEC) 20 MG capsule TAKE 1 CAPSULE BY MOUTH DAILY. 30 capsule 0  . ondansetron (ZOFRAN-ODT) 8 MG disintegrating tablet Take 1 tablet (8 mg total) by mouth every 8 (eight) hours as needed for nausea. (Patient not taking: Reported on 10/08/2015) 20 tablet 0   No current facility-administered  medications on file prior to visit.   Family History  Problem Relation Age of Onset  . Diabetes Mother   . Hypertension Mother   . Alzheimer's disease Mother   . Cancer Mother   . Heart disease Mother   . Hypertension Father   . Heart disease Father   . Cancer Father     testiculiar    Social History   Social History  . Marital Status: Married    Spouse Name: N/A  . Number of Children: N/A  . Years of Education: N/A   Occupational History  . Not on file.   Social History Main Topics  . Smoking status: Former Research scientist (life sciences)  . Smokeless tobacco: Not on file  . Alcohol Use: No  . Drug Use: No  . Sexual Activity: Yes   Other Topics Concern  . Not on file   Social History Narrative    Review of Systems: Constitutional: Negative for fever, chills, weight loss ,fatigue.Positive for fluctuation in appetite Skin: Negative for rashes or lesions of concern. HENT: Negative for ear pain, ear discharge.nose bleeds Eyes: Negative for pain, discharge, redness, itching and visual disturbance. Neck: Negative for pain, stiffness Respiratory: Negative for cough, shortness of breath,   Cardiovascular: Negative for chest pain, palpitations and leg swelling. Some swelling of hands and  feet in am Gastrointestinal: Negative for abdominal pain, nausea, vomiting, diarrhea. Positive for constipation and heartburn Genitourinary: Negative for dysuria, urgency, frequency, hematuria,  Musculoskeletal: Positive for back pain, joint pain, joint  swelling, and gait problem.Negative for weakness.Positive for fibromyalgia Neurological: Negative for dizziness, tremors, seizures, syncope,   light-headedness, numbness and headaches.  Hematological: Negative for easy bruising or bleeding Psychiatric/Behavioral: Negative for depression, anxiety, decreased concentration, confusion   Objective:   Filed Vitals:   05/05/16 1053  BP: 118/72  Pulse: 83  Temp: 97.9 F (36.6 C)  Resp: 16    Physical  Exam: Constitutional: Patient appears well-developed and well-nourished. No distress. HENT: Normocephalic, atraumatic, External right and left ear normal. Oropharynx is clear and moist.  Eyes: Conjunctivae and EOM are normal. PERRLA, no scleral icterus. Neck: Normal ROM. Neck supple. No lymphadenopathy, No thyromegaly. CVS: RRR, S1/S2 +, no murmurs, no gallops, no rubs Pulmonary: Effort and breath sounds normal, no stridor, rhonchi, wheezes, rales.  Abdominal: Soft. Normoactive BS,, no distension, tenderness, rebound or guarding.  Musculoskeletal: Normal range of motion. No edema and no tenderness.  Neuro: Alert.Normal muscle tone coordination. Non-focal Skin: Skin is warm and dry. No rash noted. Not diaphoretic. No erythema. No pallor. Psychiatric: Normal mood and affect. Behavior, judgment, thought content normal.  Lab Results  Component Value Date   WBC 8.3 12/24/2015   HGB 12.0 12/24/2015   HCT 37.1 12/24/2015   MCV 95.1 12/24/2015   PLT 322 12/24/2015   Lab Results  Component Value Date   CREATININE 0.86 12/24/2015   BUN 14 12/24/2015   NA 139 12/24/2015   K 4.1 12/24/2015   CL 105 12/24/2015   CO2 27 12/24/2015    Lab Results  Component Value Date   HGBA1C 5.2 10/08/2015   Lipid Panel     Component Value Date/Time   CHOL 131 10/08/2015 1059   TRIG 67 10/08/2015 1059   HDL 48 10/08/2015 1059   CHOLHDL 2.7 10/08/2015 1059   VLDL 13 10/08/2015 1059   LDLCALC 70 10/08/2015 1059       Assessment and plan:   1. Vaginal itching  - Wet Prep for Trich, Yeast, Clue  2. Essential hypertension -Continue current medications, no refills at this time.  3. Depression   - Will add  buPROPion (WELLBUTRIN SR) 150 MG 12 hr tablet; Take 1 tablet (150 mg total) by mouth 2 (two) times daily.  Dispense: 60 tablet; Refill: 0 - DULoxetine (CYMBALTA) 30 MG capsule; Take 1 capsule (30 mg total) by mouth 2 (two) times daily.  Dispense: 60 capsule; Refill: 5   Return in about 6  months (around 11/05/2016).  The patient was given clear instructions to go to ER or return to medical center if symptoms don't improve, worsen or new problems develop. The patient verbalized understanding.    Micheline Chapman FNP  05/05/2016, 3:23 PM

## 2016-05-05 NOTE — Patient Instructions (Addendum)
I can increase the Cymbalta but will have to investigate as to if I can add anything like Zoloft.  Continue same dosage of BP medications.

## 2016-05-17 MED FILL — DULoxetine HCL 30 MG CPEP: 30 | 30 days supply | Qty: 60 | Fill #4

## 2016-06-14 MED FILL — ?LOSARTAN POTASSIUM 100 MG: 100 | 30 days supply | Qty: 30 | Fill #3

## 2016-06-14 MED FILL — ?AMLODIPINE BESYLATE 5 MG T: 5 | 30 days supply | Qty: 30 | Fill #3

## 2016-07-04 MED FILL — DULoxetine HCL 30 MG CPEP: 30 | 30 days supply | Qty: 60 | Fill #5

## 2016-07-21 ENCOUNTER — Ambulatory Visit: Payer: No Typology Code available for payment source | Attending: Internal Medicine

## 2016-07-31 MED FILL — AMLODIPINE BESYLATE 5 MG TA: 5 | 30 days supply | Qty: 30 | Fill #4

## 2016-07-31 MED FILL — ?LOSARTAN POTASSIUM 100 MG: 100 | 30 days supply | Qty: 30 | Fill #4

## 2016-08-11 ENCOUNTER — Ambulatory Visit (INDEPENDENT_AMBULATORY_CARE_PROVIDER_SITE_OTHER): Payer: Self-pay | Admitting: Family Medicine

## 2016-08-11 VITALS — BP 135/98 | Temp 99.4°F | Resp 18 | Ht 67.0 in | Wt 197.0 lb

## 2016-08-11 DIAGNOSIS — F329 Major depressive disorder, single episode, unspecified: Secondary | ICD-10-CM

## 2016-08-11 DIAGNOSIS — Z0000 Encounter for general adult medical examination without abnormal findings: Secondary | ICD-10-CM

## 2016-08-11 DIAGNOSIS — I10 Essential (primary) hypertension: Secondary | ICD-10-CM

## 2016-08-11 DIAGNOSIS — Z23 Encounter for immunization: Secondary | ICD-10-CM

## 2016-08-11 LAB — LIPID PANEL
CHOL/HDL RATIO: 3.4 ratio (ref <=5.0)
CHOLESTEROL: 152 mg/dL (ref 125–200)
HDL: 45 mg/dL — ABNORMAL LOW (ref >=46)
LDL Cholesterol: 83 mg/dL (ref <130)
Triglycerides: 119 mg/dL (ref <150)
VLDL: 24 mg/dL (ref <30)

## 2016-08-11 LAB — CBC WITH DIFFERENTIAL/PLATELET
Basophils Absolute: 0 cells/uL (ref 0–200)
Basophils Relative: 0 %
EOS PCT: 1 %
Eosinophils Absolute: 79 cells/uL (ref 15–500)
HEMATOCRIT: 37.2 % (ref 35.0–45.0)
Hemoglobin: 12.1 g/dL (ref 11.7–15.5)
LYMPHS PCT: 35 %
Lymphs Abs: 2765 cells/uL (ref 850–3900)
MCH: 31.3 pg (ref 27.0–33.0)
MCHC: 32.5 g/dL (ref 32.0–36.0)
MCV: 96.4 fL (ref 80.0–100.0)
MONOS PCT: 9 %
MPV: 10.1 fL (ref 7.5–12.5)
Monocytes Absolute: 711 cells/uL (ref 200–950)
NEUTROS PCT: 55 %
Neutro Abs: 4345 cells/uL (ref 1500–7800)
PLATELETS: 340 10*3/uL (ref 140–400)
RBC: 3.86 MIL/uL (ref 3.80–5.10)
RDW: 12.6 % (ref 11.0–15.0)
WBC: 7.9 10*3/uL (ref 3.8–10.8)

## 2016-08-11 LAB — COMPLETE METABOLIC PANEL WITH GFR
ALT: 8 U/L (ref 6–29)
AST: 13 U/L (ref 10–35)
Albumin: 3.9 g/dL (ref 3.6–5.1)
Alkaline Phosphatase: 58 U/L (ref 33–130)
BUN: 13 mg/dL (ref 7–25)
CALCIUM: 9.5 mg/dL (ref 8.6–10.4)
CHLORIDE: 105 mmol/L (ref 98–110)
CO2: 25 mmol/L (ref 20–31)
Creat: 0.89 mg/dL (ref 0.50–1.05)
GFR, Est African American: 87 mL/min (ref >=60)
GFR, Est Non African American: 76 mL/min (ref >=60)
Glucose, Bld: 82 mg/dL (ref 65–99)
POTASSIUM: 4.2 mmol/L (ref 3.5–5.3)
Sodium: 139 mmol/L (ref 135–146)
Total Bilirubin: 0.3 mg/dL (ref 0.2–1.2)
Total Protein: 6.9 g/dL (ref 6.1–8.1)

## 2016-08-11 LAB — HEMOGLOBIN A1C
HEMOGLOBIN A1C: 4.9 % (ref <5.7)
MEAN PLASMA GLUCOSE: 94 mg/dL

## 2016-08-11 MED ORDER — FERROUS FUMARATE 325 (106 FE) MG PO TABS: 1.0000 | ORAL_TABLET | Freq: Every day | ORAL | 1 refills | Status: DC

## 2016-08-11 MED ORDER — AMLODIPINE BESYLATE 5 MG PO TABS: 5.0000 mg | ORAL_TABLET | Freq: Every day | ORAL | 5 refills | Status: DC

## 2016-08-11 MED ORDER — OMEPRAZOLE 20 MG PO CPDR: 20.0000 mg | DELAYED_RELEASE_CAPSULE | Freq: Every day | ORAL | 0 refills | Status: DC

## 2016-08-11 MED ORDER — DULOXETINE HCL 30 MG PO CPEP: 30.0000 mg | ORAL_CAPSULE | Freq: Two times a day (BID) | ORAL | 5 refills | Status: DC

## 2016-08-11 MED ORDER — LOSARTAN POTASSIUM 100 MG PO TABS: 100.0000 mg | ORAL_TABLET | Freq: Every day | ORAL | 5 refills | Status: DC

## 2016-08-11 NOTE — Progress Notes (Signed)
Patient is here for FU  Patient complains of a HA being present at a 1. Pain began today.  Patient has taken medication today and patient has eaten today.  Patient tolerated flu and tdap vaccine well today.

## 2016-08-11 NOTE — Patient Instructions (Addendum)
Return for PAP Will put in referral for dentist and eye doctor

## 2016-08-12 LAB — HIV ANTIBODY (ROUTINE TESTING W REFLEX): HIV 1&2 Ab, 4th Generation: NONREACTIVE

## 2016-08-14 NOTE — Progress Notes (Signed)
Victoria Curry, is a 50 y.o. female  [id number]  [id number]  DOB - 04-Mar-1966  CC:  Chief Complaint  Patient presents with  . Follow-up       HPI: Victoria Curry is a 50 y.o. female here for follow-up  She has a history of hypertension, GERD and fibromyalgia. Her prescribed medications are amlodipine, cymbalta, Ferrous Sulfate, Cozaar, and prilosec. She has been prescribed Wellbutrin but has never filled that.  She reports generally doing well since last visit. Her screening A1C was 5.2 in December. Her BP today is 135/98. She reports a low salt diet, no regular exercise. She denies tobacco, alcohol and drug use.   Health Maintenance: She will receive a Tdap and influenza vaccine today. She needs HIV screening. She is in need of a PAP and colon cancer screening. She is due a [place] of her right breast due to a previous abnormality.   Allergies  Allergen Reactions  . Adhesive [Tape] Other (See Comments)    Skin irritation   . Codeine Itching  . Latex Other (See Comments)    Skin irritation   . Shellfish Allergy    Past Medical History:  Diagnosis Date  . Depression   . Fibromyalgia   . GERD (gastroesophageal reflux disease)   . Hypertension    Current Outpatient Prescriptions on File Prior to Visit  Medication Sig Dispense Refill  . ondansetron (ZOFRAN-ODT) 8 MG disintegrating tablet Take 1 tablet (8 mg total) by mouth every 8 (eight) hours as needed for nausea. 20 tablet 0   No current facility-administered medications on file prior to visit.    Family History  Problem Relation Age of Onset  . Diabetes Mother   . Hypertension Mother   . Alzheimer's disease Mother   . Cancer Mother   . Heart disease Mother   . Hypertension Father   . Heart disease Father   . Cancer Father     testiculiar    Social History   Social History  . Marital status: Married    Spouse name: N/A  . Number of children: N/A  . Years of education: N/A   Occupational History  . Not  on file.   Social History Main Topics  . Smoking status: Former Research scientist (life sciences)  . Smokeless tobacco: Not on file  . Alcohol use No  . Drug use: No  . Sexual activity: Yes   Other Topics Concern  . Not on file   Social History Narrative  . No narrative on file    Review of Systems: Constitutional: Negative Skin: Negative HENT: Negative  Eyes: Negative  Neck: Negative Respiratory: Negative Cardiovascular: Negative Gastrointestinal: + for hearbturn Genitourinary: Negative  Musculoskeletal: + for shoulder and neck pain  Neurological: +for headaches, not severe Hematological: Negative  Psychiatric/Behavioral: Negative    Objective:   Vitals:   08/11/16 1318  BP: (!) 135/98  Resp: 18  Temp: 99.4 F (37.4 C)    Physical Exam: Constitutional: Patient appears well-developed and well-nourished. No distress. HENT: Normocephalic, atraumatic, External right and left ear normal. Oropharynx is clear and moist.  Eyes: Conjunctivae and EOM are normal. PERRLA, no scleral icterus. Neck: Normal ROM. Neck supple. No lymphadenopathy, No thyromegaly. CVS: RRR, S1/S2 +, no murmurs, no gallops, no rubs Pulmonary: Effort and breath sounds normal, no stridor, rhonchi, wheezes, rales.  Abdominal: Soft. Normoactive BS,, no distension, tenderness, rebound or guarding.  Musculoskeletal: Normal range of motion. No edema and no tenderness.  Neuro: Alert.Normal muscle tone coordination. Non-focal Skin: Skin  is warm and dry. No rash noted. Not diaphoretic. No erythema. No pallor. Psychiatric: Normal mood and affect. Behavior, judgment, thought content normal.  Lab Results  Component Value Date   WBC 7.9 08/11/2016   HGB 12.1 08/11/2016   HCT 37.2 08/11/2016   MCV 96.4 08/11/2016   PLT 340 08/11/2016   Lab Results  Component Value Date   CREATININE 0.89 08/11/2016   BUN 13 08/11/2016   NA 139 08/11/2016   K 4.2 08/11/2016   CL 105 08/11/2016   CO2 25 08/11/2016    Lab Results  Component  Value Date   HGBA1C 4.9 08/11/2016   Lipid Panel     Component Value Date/Time   CHOL 152 08/11/2016 1331   TRIG 119 08/11/2016 1331   HDL 45 (L) 08/11/2016 1331   CHOLHDL 3.4 08/11/2016 1331   VLDL 24 08/11/2016 1331   LDLCALC 83 08/11/2016 1331       Assessment and plan:   1. Healthcare maintenance  - Hemoglobin A1c - CBC with Differential - COMPLETE METABOLIC PANEL WITH GFR - Lipid panel - HIV antibody (with reflex) - POC Hemoccult Bld/Stl (3-Cd Home Screen); Future  2. Need for immunizations  - Tdap vaccine greater than or equal to 7yo IM - Flu Vaccine QUAD 36+ mos PF IM (Fluarix & Fluzone Quad PF)  3. Essential hypertension  - amLODipine (NORVASC) 5 MG tablet; Take 1 tablet (5 mg total) by mouth daily.  Dispense: 30 tablet; Refill: 5 - losartan (COZAAR) 100 MG tablet; Take 1 tablet (100 mg total) by mouth daily. Reported on 10/08/2015  Dispense: 30 tablet; Refill: 5  4. Reactive depression  - DULoxetine (CYMBALTA) 30 MG capsule; Take 1 capsule (30 mg total) by mouth 2 (two) times daily.  Dispense: 60 capsule; Refill: 5   Return PAP.  The patient was given clear instructions to go to ER or return to medical center if symptoms don't improve, worsen or new problems develop. The patient verbalized understanding.    Micheline Chapman FNP  08/14/2016, 1:52 PM

## 2016-08-18 ENCOUNTER — Other Ambulatory Visit (INDEPENDENT_AMBULATORY_CARE_PROVIDER_SITE_OTHER): Payer: Self-pay

## 2016-08-18 DIAGNOSIS — Z0000 Encounter for general adult medical examination without abnormal findings: Secondary | ICD-10-CM

## 2016-08-18 LAB — POC HEMOCCULT BLD/STL (HOME/3-CARD/SCREEN)
Card #3 Fecal Occult Blood, POC: NEGATIVE
FECAL OCCULT BLD: NEGATIVE
FECAL OCCULT BLD: NEGATIVE

## 2016-09-04 ENCOUNTER — Other Ambulatory Visit: Payer: Self-pay | Admitting: Family Medicine

## 2016-09-04 DIAGNOSIS — F32A Depression, unspecified: Secondary | ICD-10-CM

## 2016-09-04 DIAGNOSIS — F329 Major depressive disorder, single episode, unspecified: Secondary | ICD-10-CM

## 2016-09-04 MED FILL — AMLODIPINE BESYLATE 5 MG TA: 5 | 30 days supply | Qty: 30 | Fill #5

## 2016-09-04 MED FILL — LOSARTAN POTASSIUM 100 MG T: 100 | 30 days supply | Qty: 30 | Fill #5

## 2016-09-04 MED FILL — DULoxetine HCL 30 MG CPEP: 30 | 30 days supply | Qty: 60 | Fill #0

## 2016-09-20 ENCOUNTER — Ambulatory Visit (INDEPENDENT_AMBULATORY_CARE_PROVIDER_SITE_OTHER): Payer: Self-pay | Admitting: Family Medicine

## 2016-09-20 ENCOUNTER — Other Ambulatory Visit (HOSPITAL_COMMUNITY)
Admission: RE | Admit: 2016-09-20 | Discharge: 2016-09-20 | Disposition: A | Discharge status: Home / Self Care | Payer: No Typology Code available for payment source | Source: Ambulatory Visit | Attending: Family Medicine | Admitting: Family Medicine

## 2016-09-20 ENCOUNTER — Encounter: Payer: Self-pay | Admitting: Family Medicine

## 2016-09-20 VITALS — BP 135/99 | HR 74 | Temp 98.1°F | Resp 16 | Ht 67.0 in | Wt 199.0 lb

## 2016-09-20 DIAGNOSIS — Z124 Encounter for screening for malignant neoplasm of cervix: Secondary | ICD-10-CM

## 2016-09-20 DIAGNOSIS — Z01419 Encounter for gynecological examination (general) (routine) without abnormal findings: Secondary | ICD-10-CM | POA: Insufficient documentation

## 2016-09-20 DIAGNOSIS — K219 Gastro-esophageal reflux disease without esophagitis: Secondary | ICD-10-CM

## 2016-09-20 DIAGNOSIS — Z1151 Encounter for screening for human papillomavirus (HPV): Secondary | ICD-10-CM | POA: Insufficient documentation

## 2016-09-20 MED ORDER — PANTOPRAZOLE SODIUM 40 MG PO TBEC: 40.0000 mg | DELAYED_RELEASE_TABLET | Freq: Two times a day (BID) | ORAL | 3 refills | Status: DC

## 2016-09-20 NOTE — Patient Instructions (Signed)
Have re prescribed protonix, Try taking one twice a day for a while and see if this keeps under control better

## 2016-09-20 NOTE — Progress Notes (Signed)
Victoria Curry, is a 50 y.o. female  [id number]  [id number]  DOB - 19-Jun-1966  CC:  Chief Complaint  Patient presents with  . Gynecologic Exam       HPI: Victoria Curry is a 50 y.o. female here for PAP smear. She was seen recently for other health maintenance items and was asked to return for PAP only. She does complain of continued heartburn. She has tried Prilosec, which gives her a headache and protonix which helps but not as much as in past.  She reports having regular periods, no bleeding beween periods, no unusual discharge or pain, she is sexually active with one partner.    Allergies  Allergen Reactions  . Adhesive [Tape] Other (See Comments)    Skin irritation   . Codeine Itching  . Latex Other (See Comments)    Skin irritation   . Shellfish Allergy    Past Medical History:  Diagnosis Date  . Depression   . Fibromyalgia   . GERD (gastroesophageal reflux disease)   . Hypertension    Current Outpatient Prescriptions on File Prior to Visit  Medication Sig Dispense Refill  . amLODipine (NORVASC) 5 MG tablet Take 1 tablet (5 mg total) by mouth daily. 30 tablet 5  . DULoxetine (CYMBALTA) 30 MG capsule Take 1 capsule (30 mg total) by mouth 2 (two) times daily. 60 capsule 5  . DULoxetine (CYMBALTA) 30 MG capsule TAKE ONE CAPSULE BY MOUTH TWICE A DAY 60 capsule 5  . ferrous fumarate (HEMOCYTE - 106 MG FE) 325 (106 Fe) MG TABS tablet Take 1 tablet (106 mg of iron total) by mouth daily. 30 each 1  . losartan (COZAAR) 100 MG tablet Take 1 tablet (100 mg total) by mouth daily. Reported on 10/08/2015 30 tablet 5  . omeprazole (PRILOSEC) 20 MG capsule Take 1 capsule (20 mg total) by mouth daily. 30 capsule 0  . ondansetron (ZOFRAN-ODT) 8 MG disintegrating tablet Take 1 tablet (8 mg total) by mouth every 8 (eight) hours as needed for nausea. 20 tablet 0   No current facility-administered medications on file prior to visit.    Family History  Problem Relation Age of  Onset  . Diabetes Mother   . Hypertension Mother   . Alzheimer's disease Mother   . Cancer Mother   . Heart disease Mother   . Hypertension Father   . Heart disease Father   . Cancer Father     testiculiar    Social History   Social History  . Marital status: Married    Spouse name: N/A  . Number of children: N/A  . Years of education: N/A   Occupational History  . Not on file.   Social History Main Topics  . Smoking status: Former Research scientist (life sciences)  . Smokeless tobacco: Never Used  . Alcohol use No  . Drug use: No  . Sexual activity: Yes   Other Topics Concern  . Not on file   Social History Narrative  . No narrative on file       Objective:   Vitals:   09/20/16 1113  BP: (!) 135/99  Pulse: 74  Resp: 16  Temp: 98.1 F (36.7 C)    Physical Exam:  GYN: cervix is tilted downward. There is no unusual discharge, no friability, No CMT, No adnexal tenderness.  Lab Results  Component Value Date   WBC 7.9 08/11/2016   HGB 12.1 08/11/2016   HCT 37.2 08/11/2016   MCV 96.4 08/11/2016   PLT 340  08/11/2016   Lab Results  Component Value Date   CREATININE 0.89 08/11/2016   BUN 13 08/11/2016   NA 139 08/11/2016   K 4.2 08/11/2016   CL 105 08/11/2016   CO2 25 08/11/2016    Lab Results  Component Value Date   HGBA1C 4.9 08/11/2016   Lipid Panel     Component Value Date/Time   CHOL 152 08/11/2016 1331   TRIG 119 08/11/2016 1331   HDL 45 (L) 08/11/2016 1331   CHOLHDL 3.4 08/11/2016 1331   VLDL 24 08/11/2016 1331   LDLCALC 83 08/11/2016 1331        Assessment and plan:   1. Screening for cervical cancer  - Cytology - PAP [place]  2. Gastroesophageal reflux disease without esophagitis -Protonix 40 mg, #60 one po bid.    Return in about 6 months (around 03/20/2017).  The patient was given clear instructions to go to ER or return to medical center if symptoms don't improve, worsen or new problems develop. The patient verbalized understanding.     Micheline Chapman FNP  09/20/2016, 12:04 PM

## 2016-09-21 ENCOUNTER — Other Ambulatory Visit: Payer: Self-pay | Admitting: Family Medicine

## 2016-09-21 DIAGNOSIS — K029 Dental caries, unspecified: Secondary | ICD-10-CM

## 2016-09-21 LAB — CYTOLOGY - PAP
DIAGNOSIS: NEGATIVE
HPV (WINDOPATH): NOT DETECTED

## 2016-09-28 ENCOUNTER — Other Ambulatory Visit: Payer: Self-pay | Admitting: Family Medicine

## 2016-10-30 MED FILL — LOSARTAN POTASSIUM 100 MG T: 100 | 30 days supply | Qty: 30 | Fill #0

## 2016-10-30 MED FILL — ?AMLODIPINE BESYLATE 5 MG T: 5 | 30 days supply | Qty: 30 | Fill #0

## 2016-11-07 MED FILL — DULoxetine HCL 30 MG CPEP: 30 | 30 days supply | Qty: 60 | Fill #1

## 2016-12-18 ENCOUNTER — Ambulatory Visit: Payer: No Typology Code available for payment source | Admitting: Family Medicine

## 2016-12-22 MED FILL — DULoxetine HCL 30 MG CPEP: 30 | 30 days supply | Qty: 60 | Fill #2

## 2016-12-22 MED FILL — LOSARTAN POTASSIUM 100 MG T: 100 | 30 days supply | Qty: 30 | Fill #1

## 2016-12-25 ENCOUNTER — Ambulatory Visit: Payer: No Typology Code available for payment source | Admitting: Family Medicine

## 2016-12-25 ENCOUNTER — Ambulatory Visit (INDEPENDENT_AMBULATORY_CARE_PROVIDER_SITE_OTHER): Payer: Self-pay | Admitting: Family Medicine

## 2016-12-25 ENCOUNTER — Encounter: Payer: Self-pay | Admitting: Family Medicine

## 2016-12-25 VITALS — BP 146/96 | HR 75 | Temp 98.4°F | Resp 16 | Ht 67.0 in | Wt 198.0 lb

## 2016-12-25 DIAGNOSIS — I10 Essential (primary) hypertension: Secondary | ICD-10-CM

## 2016-12-25 DIAGNOSIS — L509 Urticaria, unspecified: Secondary | ICD-10-CM

## 2016-12-25 DIAGNOSIS — G44209 Tension-type headache, unspecified, not intractable: Secondary | ICD-10-CM

## 2016-12-25 DIAGNOSIS — R110 Nausea: Secondary | ICD-10-CM

## 2016-12-25 LAB — POCT URINALYSIS DIP (DEVICE)
Bilirubin Urine: NEGATIVE
GLUCOSE, UA: NEGATIVE mg/dL
KETONES UR: NEGATIVE mg/dL
Leukocytes, UA: NEGATIVE
Nitrite: NEGATIVE
PH: 7 (ref 5.0–8.0)
PROTEIN: NEGATIVE mg/dL
Specific Gravity, Urine: 1.02 (ref 1.005–1.030)
UROBILINOGEN UA: 1 mg/dL (ref 0.0–1.0)

## 2016-12-25 LAB — BASIC METABOLIC PANEL
BUN: 11 mg/dL (ref 7–25)
CALCIUM: 9.6 mg/dL (ref 8.6–10.4)
CO2: 28 mmol/L (ref 20–31)
CREATININE: 0.79 mg/dL (ref 0.50–1.05)
Chloride: 106 mmol/L (ref 98–110)
GLUCOSE: 88 mg/dL (ref 65–99)
Potassium: 4 mmol/L (ref 3.5–5.3)
Sodium: 140 mmol/L (ref 135–146)

## 2016-12-25 MED ORDER — ONDANSETRON 8 MG PO TBDP
8.0000 mg | ORAL_TABLET | Freq: Three times a day (TID) | ORAL | 0 refills | Status: DC | PRN
Start: 2016-12-25 — End: 2019-01-22

## 2016-12-25 MED ORDER — LOSARTAN POTASSIUM 100 MG PO TABS: 100.0000 mg | ORAL_TABLET | Freq: Every day | ORAL | 5 refills | Status: DC

## 2016-12-25 MED ORDER — AMLODIPINE BESYLATE 10 MG PO TABS: 10.0000 mg | ORAL_TABLET | Freq: Every day | ORAL | 2 refills | Status: DC

## 2016-12-25 MED FILL — AMLODIPINE BESYLATE 10 MG T: 10 | 30 days supply | Qty: 30 | Fill #0

## 2016-12-25 NOTE — Progress Notes (Signed)
Subjective:    Patient ID: Victoria Curry, female    DOB: June 01, 1966, 51 y.o.   MRN: [id number]  HPI Victoria Curry, a 51 year old female with history of hypertension presents for a 1 month follow up.  She is not exercising and is adherent to low salt diet.  Blood pressure is well controlled at home. She endorses occasional dizziness. Patient denies chest pain, dyspnea, fatigue, lower extremity edema, orthopnea, palpitations, syncope and tachypnea.  Cardiovascular risk factors include: obesity (BMI >= 30 kg/m2).   She is also complaining of occasional headache pain. She says that she has had intermittent headache pain over the past several weeks. She describes headache as dull and intermittent. She has not attempted OTC analgesics to alleviate current symptoms. She denies recent falls, paresthesias, neuropathy, chest pain, palpitations. She endorses nausea.    Past Medical History:  Diagnosis Date  . Depression   . Fibromyalgia   . GERD (gastroesophageal reflux disease)   . Hypertension    Past Surgical History:  Procedure Laterality Date  . BREAST LUMPECTOMY     Immunization History  Administered Date(s) Administered  . Influenza,inj,Quad PF,36+ Mos 11/11/2015, 08/11/2016  . Tdap 08/11/2016   Allergies  Allergen Reactions  . Adhesive [Tape] Other (See Comments)    Skin irritation   . Codeine Itching  . Latex Other (See Comments)    Skin irritation   . Shellfish Allergy    Social History   Social History  . Marital status: Married    Spouse name: N/A  . Number of children: N/A  . Years of education: N/A   Occupational History  . Not on file.   Social History Main Topics  . Smoking status: Former Research scientist (life sciences)  . Smokeless tobacco: Never Used  . Alcohol use No  . Drug use: No  . Sexual activity: Yes   Other Topics Concern  . Not on file   Social History Narrative  . No narrative on file   Review of Systems  Constitutional: Negative.   HENT: Negative.    Eyes: Negative.   Respiratory: Negative.   Cardiovascular: Negative.  Negative for chest pain, palpitations and leg swelling.  Gastrointestinal: Negative.  Negative for nausea.  Endocrine: Negative.  Negative for polydipsia, polyphagia and polyuria.  Genitourinary: Negative.   Musculoskeletal: Negative.   Skin: Negative.   Allergic/Immunologic: Negative.   Neurological: Positive for dizziness and headaches. Negative for tremors and weakness.  Hematological: Negative.   Psychiatric/Behavioral: Negative.        Objective:   Physical Exam  Constitutional: She is oriented to person, place, and time. She appears well-developed and well-nourished.  HENT:  Head: Normocephalic and atraumatic.  Right Ear: External ear normal.  Left Ear: External ear normal.  Mouth/Throat: Oropharynx is clear and moist.  Eyes: Conjunctivae and EOM are normal. Pupils are equal, round, and reactive to light.  Neck: Normal range of motion. Neck supple.  Cardiovascular: Normal rate, regular rhythm, normal heart sounds and intact distal pulses.   Pulmonary/Chest: Effort normal and breath sounds normal. Left breast exhibits no nipple discharge.  Abdominal: Soft. Bowel sounds are normal.  Neurological: She is alert and oriented to person, place, and time. She has normal reflexes.  Skin: Skin is warm and dry.  Psychiatric: She has a normal mood and affect. Her behavior is normal. Judgment and thought content normal.     BP (!) 146/96   Pulse 75   Temp 98.4 F (36.9 C) (Oral)   Resp  16   Ht 5\' 7"  (1.702 m)   Wt 198 lb (89.8 kg)   LMP 12/22/2016 Comment: spotting   SpO2 100%   BMI 31.01 kg/m  Assessment & Plan:   1. Essential hypertension Blood pressure is above goal on current medication regimen. The patient is asked to make an attempt to improve diet and exercise patterns to aid in medical management of this problem. - amLODipine (NORVASC) 10 MG tablet; Take 1 tablet (10 mg total) by mouth daily.   Dispense: 30 tablet; Refill: 2 - Basic Metabolic Panel - losartan (COZAAR) 100 MG tablet; Take 1 tablet (100 mg total) by mouth daily. Reported on 10/08/2015  Dispense: 30 tablet; Refill: 5  2. Acute non intractable tension-type headache Recommend resting in a dark, quiet room. Apply cool compresses to base of neck.  Also, recommend Tylenon 500 mg every 6 hours as directed.   3. Nausea - ondansetron (ZOFRAN-ODT) 8 MG disintegrating tablet; Take 1 tablet (8 mg total) by mouth every 8 (eight) hours as needed for nausea.  Dispense: 20 tablet; Refill: 0   RTC:  1 month for bp check. Will follow up in 3 months for hypertension. Will follow up by phone with laboratory results.   The patient was given clear instructions to go to ER or return to medical center if symptoms do not improve, worsen or new problems develop. The patient verbalized understanding. Will notify patient with laboratory results.   Dorena Dew, FNP

## 2016-12-26 MED FILL — ONDANSETRON ODT 8 MG TABLET: 8 | 7 days supply | Qty: 20 | Fill #0

## 2017-01-01 ENCOUNTER — Ambulatory Visit: Payer: Self-pay | Attending: Family Medicine

## 2017-01-25 ENCOUNTER — Ambulatory Visit: Payer: Self-pay

## 2017-01-25 VITALS — BP 112/80

## 2017-01-25 DIAGNOSIS — I10 Essential (primary) hypertension: Secondary | ICD-10-CM

## 2017-02-12 MED FILL — DULoxetine HCL 30 MG CPEP: 30 | 30 days supply | Qty: 60 | Fill #3

## 2017-02-12 MED FILL — AMLODIPINE BESYLATE 10 MG T: 10 | 30 days supply | Qty: 30 | Fill #1

## 2017-02-28 MED FILL — LOSARTAN POTASSIUM 100 MG T: 100 | 30 days supply | Qty: 30 | Fill #2

## 2017-03-05 ENCOUNTER — Ambulatory Visit (INDEPENDENT_AMBULATORY_CARE_PROVIDER_SITE_OTHER): Payer: No Typology Code available for payment source | Admitting: Family Medicine

## 2017-03-05 ENCOUNTER — Encounter: Payer: Self-pay | Admitting: Family Medicine

## 2017-03-05 VITALS — BP 126/90 | HR 83 | Temp 97.9°F | Resp 16 | Ht 67.0 in | Wt 206.0 lb

## 2017-03-05 DIAGNOSIS — I10 Essential (primary) hypertension: Secondary | ICD-10-CM

## 2017-03-05 DIAGNOSIS — Z0000 Encounter for general adult medical examination without abnormal findings: Secondary | ICD-10-CM

## 2017-03-05 DIAGNOSIS — N921 Excessive and frequent menstruation with irregular cycle: Secondary | ICD-10-CM

## 2017-03-05 DIAGNOSIS — F419 Anxiety disorder, unspecified: Secondary | ICD-10-CM

## 2017-03-05 LAB — CBC WITH DIFFERENTIAL/PLATELET
BASOS ABS: 0 {cells}/uL (ref 0–200)
BASOS PCT: 0 %
Eosinophils Absolute: 142 cells/uL (ref 15–500)
Eosinophils Relative: 2 %
HCT: 41.2 % (ref 35.0–45.0)
HEMOGLOBIN: 13.6 g/dL (ref 11.7–15.5)
LYMPHS ABS: 2769 {cells}/uL (ref 850–3900)
Lymphocytes Relative: 39 %
MCH: 31.8 pg (ref 27.0–33.0)
MCHC: 33 g/dL (ref 32.0–36.0)
MCV: 96.3 fL (ref 80.0–100.0)
MONOS PCT: 8 %
MPV: 9.8 fL (ref 7.5–12.5)
Monocytes Absolute: 568 cells/uL (ref 200–950)
NEUTROS ABS: 3621 {cells}/uL (ref 1500–7800)
Neutrophils Relative %: 51 %
PLATELETS: 352 10*3/uL (ref 140–400)
RBC: 4.28 MIL/uL (ref 3.80–5.10)
RDW: 12.9 % (ref 11.0–15.0)
WBC: 7.1 10*3/uL (ref 3.8–10.8)

## 2017-03-05 LAB — POCT URINALYSIS DIP (DEVICE)
BILIRUBIN URINE: NEGATIVE
Glucose, UA: NEGATIVE mg/dL
Ketones, ur: NEGATIVE mg/dL
LEUKOCYTES UA: NEGATIVE
NITRITE: NEGATIVE
PH: 7 (ref 5.0–8.0)
Protein, ur: NEGATIVE mg/dL
SPECIFIC GRAVITY, URINE: 1.02 (ref 1.005–1.030)
UROBILINOGEN UA: 0.2 mg/dL (ref 0.0–1.0)

## 2017-03-05 MED ORDER — BUSPIRONE HCL 5 MG PO TABS: 5.0000 mg | ORAL_TABLET | Freq: Two times a day (BID) | ORAL | 1 refills | Status: DC

## 2017-03-05 MED FILL — ?BUSPIRONE HCL 5 MG TABLET: 5 | 30 days supply | Qty: 60 | Fill #0

## 2017-03-05 NOTE — Progress Notes (Signed)
[name], a 51 year old female with a history of hypertension and menorrhagia present complaining of anxiety. She says that anxiety has increased over the past month due to home stressors. She attributes anxiety to a mutual separation from her spouse. Patient is currently receiving spiritual counseling from church counselor.    Anxiety  Presents for initial visit. Symptoms include decreased concentration, excessive worry, insomnia, nervous/anxious behavior, palpitations and shortness of breath. Patient reports no chest pain or feeling of choking. Primary symptoms comment: Patient is going through a separation after 5 years of marriage. . Symptoms occur constantly. The severity of symptoms is mild. The symptoms are aggravated by family issues and work stress. The quality of sleep is poor. Nighttime awakenings: several.   Risk factors include a major life event. Her past medical history is significant for anxiety/panic attacks and depression. There is no history of suicide attempts. Past treatments include counseling (CBT) (Spiritual counseling at church. ).    Past Medical History:  Diagnosis Date  . Depression   . Fibromyalgia   . GERD (gastroesophageal reflux disease)   . Hypertension    Social History   Social History  . Marital status: Married    Spouse name: N/A  . Number of children: N/A  . Years of education: N/A   Occupational History  . Not on file.   Social History Main Topics  . Smoking status: Former Research scientist (life sciences)  . Smokeless tobacco: Never Used  . Alcohol use No  . Drug use: No  . Sexual activity: Yes   Other Topics Concern  . Not on file   Social History Narrative  . No narrative on file   Immunization History  Administered Date(s) Administered  . Influenza,inj,Quad PF,36+ Mos 11/11/2015, 08/11/2016  . Tdap 08/11/2016   Allergies  Allergen Reactions  . Adhesive [Tape] Other (See Comments)    Skin irritation   . Codeine Itching  . Latex Other (See  Comments)    Skin irritation   . Shellfish Allergy    Review of Systems  Constitutional: Negative.  Negative for diaphoresis.  HENT: Negative.   Eyes: Negative.   Respiratory: Positive for shortness of breath.   Cardiovascular: Positive for palpitations. Negative for chest pain.  Gastrointestinal: Negative.   Genitourinary: Negative.   Musculoskeletal: Negative.   Skin: Negative.   Neurological: Negative.  Negative for weakness.  Endo/Heme/Allergies: Negative.   Psychiatric/Behavioral: Positive for decreased concentration and depression. The patient is nervous/anxious and has insomnia.        Anxiety  Physical Exam  Constitutional: She is oriented to person, place, and time and well-developed, well-nourished, and in no distress.  HENT:  Head: Normocephalic and atraumatic.  Right Ear: External ear normal.  Left Ear: External ear normal.  Nose: Nose normal.  Mouth/Throat: Oropharynx is clear and moist.  Eyes: Conjunctivae and EOM are normal. Pupils are equal, round, and reactive to light.  Neck: Normal range of motion. Neck supple.  Cardiovascular: Normal rate, regular rhythm, normal heart sounds and intact distal pulses.   Pulmonary/Chest: Effort normal and breath sounds normal.  Abdominal: Soft. Bowel sounds are normal.  Musculoskeletal: Normal range of motion.  Neurological: She is alert and oriented to person, place, and time. Gait normal.  Skin: Skin is warm and dry.  Psychiatric: Mood, memory, affect and judgment normal.  Plan  BP 126/90 (BP Location: Left Arm, Patient Position: Sitting, Cuff Size: Normal)   Pulse 83   Temp 97.9 F (36.6 C) (Oral)   Resp  16   Ht 5\' 7"  (1.702 m)   Wt 206 lb (93.4 kg)   LMP 01/15/2017   BMI 32.26 kg/m \ 1. Anxiety GAD 7 : Generalized Anxiety Score 03/05/2017  Nervous, Anxious, on Edge 3  Control/stop worrying 1  Worry too much - different things 2  Trouble relaxing 1  Restless 1   Patient denies suicidal/homicidal intent.  -  busPIRone (BUSPAR) 5 MG tablet; Take 1 tablet (5 mg total) by mouth 2 (two) times daily.  Dispense: 60 tablet; Refill: 1  2. Menorrhagia with irregular cycle - US Pelvis Complete; Future - US Transvaginal Non-OB; Future - CBC with Differential  3. Essential hypertension Blood pressure is at goal on current medication regimen - CBC with Differential - COMPLETE METABOLIC PANEL WITH GFR   RTC: Greater than 50% of visit spent counseling   [name]  MSN, FNP-C [place], [place] [address] [phone]

## 2017-03-05 NOTE — Patient Instructions (Addendum)
Anxiety: Will start a trial of Buspar 5 mg twice daily for anxiety. Recommend continued counseling at church.  Menorrhagia- Pelvic transvaginal ultrasound Hypertension: continue current medication regimen. Blood pressure is at goal Recommend a lowfat, low carbohydrate diet divided over 5-6 small meals, increase water intake to 6-8 glasses, and 150 minutes per week of cardiovascular exercise.     Living With Anxiety After being diagnosed with an anxiety disorder, you may be relieved to know why you have felt or behaved a certain way. It is natural to also feel overwhelmed about the treatment ahead and what it will mean for your life. With care and support, you can manage this condition and recover from it. How to cope with anxiety Dealing with stress  Stress is your body's reaction to life changes and events, both good and bad. Stress can last just a few hours or it can be ongoing. Stress can play a major role in anxiety, so it is important to learn both how to cope with stress and how to think about it differently. Talk with your health care provider or a counselor to learn more about stress reduction. He or she may suggest some stress reduction techniques, such as:  Music therapy. This can include creating or listening to music that you enjoy and that inspires you.  Mindfulness-based meditation. This involves being aware of your normal breaths, rather than trying to control your breathing. It can be done while sitting or walking.  Centering prayer. This is a kind of meditation that involves focusing on a word, phrase, or sacred image that is meaningful to you and that brings you peace.  Deep breathing. To do this, expand your stomach and inhale slowly through your nose. Hold your breath for 3-5 seconds. Then exhale slowly, allowing your stomach muscles to relax.  Self-talk. This is a skill where you identify thought patterns that lead to anxiety reactions and correct those thoughts.  Muscle  relaxation. This involves tensing muscles then relaxing them. Choose a stress reduction technique that fits your lifestyle and personality. Stress reduction techniques take time and practice. Set aside 5-15 minutes a day to do them. Therapists can offer training in these techniques. The training may be covered by some insurance plans. Other things you can do to manage stress include:  Keeping a stress diary. This can help you learn what triggers your stress and ways to control your response.  Thinking about how you respond to certain situations. You may not be able to control everything, but you can control your reaction.  Making time for activities that help you relax, and not feeling guilty about spending your time in this way. Therapy combined with coping and stress-reduction skills provides the best chance for successful treatment. Medicines  Medicines can help ease symptoms. Medicines for anxiety include:  Anti-anxiety drugs.  Antidepressants.  Beta-blockers. Medicines may be used as the main treatment for anxiety disorder, along with therapy, or if other treatments are not working. Medicines should be prescribed by a health care provider. Relationships  Relationships can play a big part in helping you recover. Try to spend more time connecting with trusted friends and family members. Consider going to couples counseling, taking family education classes, or going to family therapy. Therapy can help you and others better understand the condition. How to recognize changes in your condition Everyone has a different response to treatment for anxiety. Recovery from anxiety happens when symptoms decrease and stop interfering with your daily activities at home or work.  This may mean that you will start to:  Have better concentration and focus.  Sleep better.  Be less irritable.  Have more energy.  Have improved memory. It is important to recognize when your condition is getting worse.  Contact your health care provider if your symptoms interfere with home or work and you do not feel like your condition is improving. Where to find help and support: You can get help and support from these sources:  Self-help groups.  Online and [employer].  A trusted spiritual leader.  Couples counseling.  Family education classes.  Family therapy. Follow these instructions at home:  Eat a healthy diet that includes plenty of vegetables, fruits, whole grains, low-fat dairy products, and lean protein. Do not eat a lot of foods that are high in solid fats, added sugars, or salt.  Exercise. Most adults should do the following:  Exercise for at least 150 minutes each week. The exercise should increase your heart rate and make you sweat (moderate-intensity exercise).  Strengthening exercises at least twice a week.  Cut down on caffeine, tobacco, alcohol, and other potentially harmful substances.  Get the right amount and quality of sleep. Most adults need 7-9 hours of sleep each night.  Make choices that simplify your life.  Take over-the-counter and prescription medicines only as told by your health care provider.  Avoid caffeine, alcohol, and certain over-the-counter cold medicines. These may make you feel worse. Ask your pharmacist which medicines to avoid.  Keep all follow-up visits as told by your health care provider. This is important. Questions to ask your health care provider  Would I benefit from therapy?  How often should I follow up with a health care provider?  How long do I need to take medicine?  Are there any long-term side effects of my medicine?  Are there any alternatives to taking medicine? Contact a health care provider if:  You have a hard time staying focused or finishing daily tasks.  You spend many hours a day feeling worried about everyday life.  You become exhausted by worry.  You start to have headaches, feel tense, or have  nausea.  You urinate more than normal.  You have diarrhea. Get help right away if:  You have a racing heart and shortness of breath.  You have thoughts of hurting yourself or others. If you ever feel like you may hurt yourself or others, or have thoughts about taking your own life, get help right away. You can go to your nearest emergency department or call:  Your local emergency services (911 in the [place]  A suicide crisis helpline, such as the [facility] at 952-605-9721. This is open 24-hours a day. Summary  Taking steps to deal with stress can help calm you.  Medicines cannot cure anxiety disorders, but they can help ease symptoms.  Family, friends, and partners can play a big part in helping you recover from an anxiety disorder. This information is not intended to replace advice given to you by your health care provider. Make sure you discuss any questions you have with your health care provider. Document Released: 10/03/2016 Document Revised: 10/03/2016 Document Reviewed: 10/03/2016 Elsevier Interactive Patient Education  2017 Elsevier Inc. Buspirone tablets What is this medicine? BUSPIRONE (byoo SPYE rone) is used to treat anxiety disorders. This medicine may be used for other purposes; ask your health care provider or pharmacist if you have questions. COMMON BRAND NAME(S): BuSpar What should I tell my health care provider  before I take this medicine? They need to know if you have any of these conditions: -kidney or liver disease -an unusual or allergic reaction to buspirone, other medicines, foods, dyes, or preservatives -pregnant or trying to get pregnant -breast-feeding How should I use this medicine? Take this medicine by mouth with a glass of water. Follow the directions on the prescription label. You may take this medicine with or without food. To ensure that this medicine always works the same way for you, you should take it either  always with or always without food. Take your doses at regular intervals. Do not take your medicine more often than directed. Do not stop taking except on the advice of your doctor or health care professional. Talk to your pediatrician regarding the use of this medicine in children. Special care may be needed. Overdosage: If you think you have taken too much of this medicine contact a poison control center or emergency room at once. NOTE: This medicine is only for you. Do not share this medicine with others. What if I miss a dose? If you miss a dose, take it as soon as you can. If it is almost time for your next dose, take only that dose. Do not take double or extra doses. What may interact with this medicine? Do not take this medicine with any of the following medications: -linezolid -MAOIs like Carbex, Eldepryl, Marplan, Nardil, and Parnate -methylene blue -procarbazine This medicine may also interact with the following medications: -diazepam -digoxin -diltiazem -erythromycin -grapefruit juice -haloperidol -medicines for mental depression or mood problems -medicines for seizures like carbamazepine, phenobarbital and phenytoin -nefazodone -other medications for anxiety -rifampin -ritonavir -some antifungal medicines like itraconazole, ketoconazole, and voriconazole -verapamil -warfarin This list may not describe all possible interactions. Give your health care provider a list of all the medicines, herbs, non-prescription drugs, or dietary supplements you use. Also tell them if you smoke, drink alcohol, or use illegal drugs. Some items may interact with your medicine. What should I watch for while using this medicine? Visit your doctor or health care professional for regular checks on your progress. It may take 1 to 2 weeks before your anxiety gets better. You may get drowsy or dizzy. Do not drive, use machinery, or do anything that needs mental alertness until you know how this drug  affects you. Do not stand or sit up quickly, especially if you are an older patient. This reduces the risk of dizzy or fainting spells. Alcohol can make you more drowsy and dizzy. Avoid alcoholic drinks. What side effects may I notice from receiving this medicine? Side effects that you should report to your doctor or health care professional as soon as possible: -blurred vision or other vision changes -chest pain -confusion -difficulty breathing -feelings of hostility or anger -muscle aches and pains -numbness or tingling in hands or feet -ringing in the ears -skin rash and itching -vomiting -weakness Side effects that usually do not require medical attention (report to your doctor or health care professional if they continue or are bothersome): -disturbed dreams, nightmares -headache -nausea -restlessness or nervousness -sore throat and nasal congestion -stomach upset This list may not describe all possible side effects. Call your doctor for medical advice about side effects. You may report side effects to FDA at 1-800-FDA-1088. Where should I keep my medicine? Keep out of the reach of children. Store at room temperature below 30 degrees C (86 degrees F). Protect from light. Keep container tightly closed. Throw away any unused medicine  after the expiration date. NOTE: This sheet is a summary. It may not cover all possible information. If you have questions about this medicine, talk to your doctor, pharmacist, or health care provider.  2018 Elsevier/Gold Standard (2010-05-19 18:06:11)

## 2017-03-06 LAB — COMPLETE METABOLIC PANEL WITH GFR
ALT: 12 U/L (ref 6–29)
AST: 16 U/L (ref 10–35)
Albumin: 4.2 g/dL (ref 3.6–5.1)
Alkaline Phosphatase: 69 U/L (ref 33–130)
BUN: 12 mg/dL (ref 7–25)
CHLORIDE: 104 mmol/L (ref 98–110)
CO2: 27 mmol/L (ref 20–31)
CREATININE: 0.99 mg/dL (ref 0.50–1.05)
Calcium: 9.2 mg/dL (ref 8.6–10.4)
GFR, Est African American: 76 mL/min (ref >=60)
GFR, Est Non African American: 66 mL/min (ref >=60)
GLUCOSE: 90 mg/dL (ref 65–99)
Potassium: 4 mmol/L (ref 3.5–5.3)
SODIUM: 140 mmol/L (ref 135–146)
Total Bilirubin: 0.3 mg/dL (ref 0.2–1.2)
Total Protein: 7.4 g/dL (ref 6.1–8.1)

## 2017-03-20 ENCOUNTER — Other Ambulatory Visit: Payer: Self-pay | Admitting: Family Medicine

## 2017-03-20 DIAGNOSIS — I10 Essential (primary) hypertension: Secondary | ICD-10-CM

## 2017-03-20 MED FILL — ?DULOXETINE HCL DR 30 MG CA: 30 MG | 30 days supply | Qty: 60 | Fill #4

## 2017-03-20 MED FILL — AMLODIPINE BESYLATE 10 MG T: 10 | 30 days supply | Qty: 30 | Fill #2

## 2017-03-27 ENCOUNTER — Ambulatory Visit: Payer: No Typology Code available for payment source | Admitting: Family Medicine

## 2017-04-03 ENCOUNTER — Ambulatory Visit (INDEPENDENT_AMBULATORY_CARE_PROVIDER_SITE_OTHER): Payer: No Typology Code available for payment source | Admitting: Family Medicine

## 2017-04-03 ENCOUNTER — Encounter: Payer: Self-pay | Admitting: Family Medicine

## 2017-04-03 VITALS — BP 123/78 | HR 84 | Temp 98.0°F | Resp 16 | Ht 67.0 in | Wt 208.0 lb

## 2017-04-03 DIAGNOSIS — F419 Anxiety disorder, unspecified: Secondary | ICD-10-CM

## 2017-04-03 DIAGNOSIS — N951 Menopausal and female climacteric states: Secondary | ICD-10-CM

## 2017-04-03 DIAGNOSIS — F32A Depression, unspecified: Secondary | ICD-10-CM

## 2017-04-03 DIAGNOSIS — I10 Essential (primary) hypertension: Secondary | ICD-10-CM

## 2017-04-03 DIAGNOSIS — F329 Major depressive disorder, single episode, unspecified: Secondary | ICD-10-CM

## 2017-04-03 LAB — POCT URINALYSIS DIP (DEVICE)
Bilirubin Urine: NEGATIVE
GLUCOSE, UA: NEGATIVE mg/dL
Ketones, ur: NEGATIVE mg/dL
Leukocytes, UA: NEGATIVE
NITRITE: NEGATIVE
PH: 6.5 (ref 5.0–8.0)
PROTEIN: NEGATIVE mg/dL
Specific Gravity, Urine: 1.02 (ref 1.005–1.030)
UROBILINOGEN UA: 0.2 mg/dL (ref 0.0–1.0)

## 2017-04-03 NOTE — Progress Notes (Signed)
Victoria Curry, a 51 year old female presents for a 1 month follow up of hypertension and anxiety. She says that symptoms of anxiety and depression have improved since starting Buspar 10 mg twice daily. She has also been participating in counseling session with her spouse. She denies feelings of anhedonia, racing thoughts, hopelessness, or suicidal/homicidal ideations.    Anxiety  Presents for initial visit. Symptoms include nervous/anxious behavior. Patient reports no chest pain, feeling of choking, palpitations or shortness of breath. Primary symptoms comment: Patient is going through a separation after 5 years of marriage. . Symptoms occur constantly. The severity of symptoms is mild. The symptoms are aggravated by family issues and work stress. The quality of sleep is poor. Nighttime awakenings: several.   Risk factors include a major life event. Her past medical history is significant for anxiety/panic attacks and depression. There is no history of suicide attempts. Past treatments include counseling (CBT) (Spiritual counseling at church. ).  Depression       The patient presents with depression.(Patient is going through a separation after 5 years of marriage. )  This is a chronic problem.  Past medical history includes depression.     Pertinent negatives include no thyroid problem, no anxiety and no suicide attempts.   Past Medical History:  Diagnosis Date  . Depression   . Fibromyalgia   . GERD (gastroesophageal reflux disease)   . Hypertension    Social History   Social History  . Marital status: Married    Spouse name: N/A  . Number of children: N/A  . Years of education: N/A   Occupational History  . Not on file.   Social History Main Topics  . Smoking status: Former Research scientist (life sciences)  . Smokeless tobacco: Never Used  . Alcohol use No  . Drug use: No  . Sexual activity: Yes   Other Topics Concern  . Not on file   Social History Narrative  . No narrative on file    Immunization History  Administered Date(s) Administered  . Influenza,inj,Quad PF,36+ Mos 11/11/2015, 08/11/2016  . Tdap 08/11/2016   Allergies  Allergen Reactions  . Pineapple Swelling  . Adhesive [Tape] Other (See Comments)    Skin irritation   . Codeine Itching  . Latex Other (See Comments)    Skin irritation   . Shellfish Allergy    Review of Systems  Constitutional: Negative.  Negative for diaphoresis.  HENT: Negative.   Eyes: Negative.   Respiratory: Negative for shortness of breath.   Cardiovascular: Negative for chest pain and palpitations.  Gastrointestinal: Negative.   Genitourinary: Negative.   Musculoskeletal: Negative.   Skin: Negative.   Neurological: Negative.  Negative for weakness.  Endo/Heme/Allergies: Negative.   Psychiatric/Behavioral: Positive for depression. The patient is nervous/anxious.        Anxiety  Physical Exam  Constitutional: She is oriented to person, place, and time and well-developed, well-nourished, and in no distress.  HENT:  Head: Normocephalic and atraumatic.  Right Ear: External ear normal.  Left Ear: External ear normal.  Nose: Nose normal.  Mouth/Throat: Oropharynx is clear and moist.  Eyes: Conjunctivae and EOM are normal. Pupils are equal, round, and reactive to light.  Neck: Normal range of motion. Neck supple.  Cardiovascular: Normal rate, regular rhythm, normal heart sounds and intact distal pulses.   Pulmonary/Chest: Effort normal and breath sounds normal.  Abdominal: Soft. Bowel sounds are normal.  Musculoskeletal: Normal range of motion.  Neurological: She is alert and oriented to person, place, and  time. Gait normal.  Skin: Skin is warm and dry.  Psychiatric: Mood, memory, affect and judgment normal.  Plan  BP 123/78 (BP Location: Left Arm, Patient Position: Sitting, Cuff Size: Large)   Pulse 84   Temp 98 F (36.7 C)   Resp 16   Ht 5\' 7"  (1.702 m)   Wt 208 lb (94.3 kg)   LMP 01/08/2017   SpO2 100%   BMI  32.58 kg/m \  1. Depression, unspecified depression type Will continue Buspar at current dosage.  Depression screen [facility] 2/9 04/03/2017 03/05/2017 03/05/2017 12/25/2016 09/20/2016  Decreased Interest 0 - 2 3 1   Down, Depressed, Hopeless 0 3 3 3 2   PHQ - 2 Score 0 3 5 6 3   Altered sleeping - 2 2 3  -  Tired, decreased energy - 1 1 3  -  Change in appetite - 0 2 3 -  Feeling bad or failure about yourself  - 3 3 3  -  Trouble concentrating - 1 3 3  -  Moving slowly or fidgety/restless - 0 3 3 -  Suicidal thoughts - 1 2 0 -  PHQ-9 Score - 11 21 24  -  Difficult doing work/chores - - Somewhat difficult Very difficult -    2. Anxiety GAD 7 : Generalized Anxiety Score 04/03/2017 03/07/2017 03/05/2017  Nervous, Anxious, on Edge 1 - 3  Control/stop worrying 0 - 1  Worry too much - different things 0 - 2  Trouble relaxing 0 - 1  Restless 0 - 1  Easily annoyed or irritable 0 1 -  Afraid - awful might happen 1 1 -  Total GAD 7 Score 2 - -  Anxiety Difficulty Not difficult at all Somewhat difficult -     3. Essential hypertension Blood pressure at goal on current medication regimen. Will continue as prescribed.   4. Hot flashes due to menopause Discussed menopause at length. Menopause is the time that marks the end of your menstrual cycles. It's diagnosed after you've gone 12 months without a menstrual period.  Menopause can happen in your 55s or 78s, but the average age is 71 in the [place]. Menopause is a natural biological process. But the physical symptoms, such as hot flashes, and emotional symptoms of menopause may disrupt your sleep, lower your energy or affect emotional health.  There are many effective treatments available, from lifestyle adjustments to hormone therapy.    RTC: 3 months for hypertension   [name]  MSN, FNP-C [facility] [address] [place], [place] [address] [phone]

## 2017-04-03 NOTE — Patient Instructions (Signed)
Recommend black cohosh Menopause is the time that marks the end of your menstrual cycles. It's diagnosed after you've gone 12 months without a menstrual period.  Menopause can happen in your 40s or 69s, but the average age is 64 in the [place]. Menopause is a natural biological process. But the physical symptoms, such as hot flashes, and emotional symptoms of menopause may disrupt your sleep, lower your energy or affect emotional health.  There are many effective treatments available, from lifestyle adjustments to hormone therapy.    [facility]: [phone]

## 2017-04-06 ENCOUNTER — Ambulatory Visit (HOSPITAL_COMMUNITY)
Admission: RE | Admit: 2017-04-06 | Discharge: 2017-04-06 | Disposition: A | Discharge status: Home / Self Care | Payer: No Typology Code available for payment source | Source: Ambulatory Visit | Attending: Family Medicine | Admitting: Family Medicine

## 2017-04-06 ENCOUNTER — Other Ambulatory Visit: Payer: Self-pay | Admitting: Family Medicine

## 2017-04-06 DIAGNOSIS — D259 Leiomyoma of uterus, unspecified: Secondary | ICD-10-CM | POA: Insufficient documentation

## 2017-04-06 DIAGNOSIS — N921 Excessive and frequent menstruation with irregular cycle: Secondary | ICD-10-CM | POA: Insufficient documentation

## 2017-04-06 NOTE — Progress Notes (Signed)
Reviewed pelvic/transvaginal ultrasound. Image showed the following results.   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 One of these masses distorts the endometrium. Endometrium does not appear appreciably thickened.  Right ovary appears unremarkable. Left ovary could not be seen by either transabdominal or transvaginal technique. No left-sided pelvic mass. Trace free pelvic fluid may be physiologic.  If further evaluation of the endometrium and/or left ovary is felt to be warranted given clinical symptoms, pelvis MR would be the imaging study of choice for further assessment.   Will send a referral to gynecology for further workup and evaluation.   [name]  MSN, FNP-C [facility] [address] [place], [place] [address] [phone]

## 2017-04-06 NOTE — Progress Notes (Signed)
The phone number in the chart was incorrect. I will mail letter advising patient of this. Thanks!

## 2017-04-30 ENCOUNTER — Other Ambulatory Visit: Payer: Self-pay | Admitting: Family Medicine

## 2017-04-30 ENCOUNTER — Telehealth: Payer: Self-pay

## 2017-04-30 DIAGNOSIS — K0889 Other specified disorders of teeth and supporting structures: Secondary | ICD-10-CM

## 2017-04-30 DIAGNOSIS — I10 Essential (primary) hypertension: Secondary | ICD-10-CM

## 2017-04-30 MED FILL — busPIRone HCL 5 MG TABS: 5 | 30 days supply | Qty: 60 | Fill #1

## 2017-04-30 MED FILL — ?DULOXETINE HCL DR 30 MG CA: 30 MG | 30 days supply | Qty: 60 | Fill #5

## 2017-04-30 MED FILL — LOSARTAN POTASSIUM 100 MG T: 100 | 30 days supply | Qty: 30 | Fill #3

## 2017-04-30 MED FILL — ?AMLODIPINE BESYLATE 10 MG: 10 | 30 days supply | Qty: 30 | Fill #0 | Status: TO

## 2017-04-30 NOTE — Telephone Encounter (Signed)
Stat referral faxed into [facility] today 04/30/2017 @9 :46am. Thanks!

## 2017-06-12 ENCOUNTER — Encounter: Payer: Self-pay | Admitting: Obstetrics and Gynecology

## 2017-06-14 ENCOUNTER — Other Ambulatory Visit: Payer: Self-pay | Admitting: Family Medicine

## 2017-06-14 DIAGNOSIS — F32A Depression, unspecified: Secondary | ICD-10-CM

## 2017-06-14 DIAGNOSIS — F329 Major depressive disorder, single episode, unspecified: Secondary | ICD-10-CM

## 2017-06-14 DIAGNOSIS — F419 Anxiety disorder, unspecified: Secondary | ICD-10-CM

## 2017-06-27 MED FILL — busPIRone HCL 5 MG TABS: 5 | 30 days supply | Qty: 60 | Fill #0 | Status: TO

## 2017-06-27 MED FILL — ?DULOXETINE HCL DR 30 MG CA: 30 MG | 30 days supply | Qty: 60 | Fill #0 | Status: TO

## 2017-06-29 MED FILL — CLINDAMYCIN HCL 300 MG CAP: 300 | 10 days supply | Qty: 30 | Fill #0 | Status: TO

## 2017-06-29 MED FILL — LOSARTAN POTASSIUM 100 MG T: 100 | 30 days supply | Qty: 30 | Fill #4 | Status: TO

## 2017-07-02 MED FILL — AMLODIPINE BESYLATE 10 MG T: 10 | 30 days supply | Qty: 30 | Fill #1 | Status: TO

## 2017-07-05 ENCOUNTER — Ambulatory Visit: Payer: No Typology Code available for payment source | Admitting: Family Medicine

## 2017-07-11 ENCOUNTER — Encounter: Payer: No Typology Code available for payment source | Admitting: Obstetrics and Gynecology

## 2017-07-13 ENCOUNTER — Ambulatory Visit (INDEPENDENT_AMBULATORY_CARE_PROVIDER_SITE_OTHER): Payer: No Typology Code available for payment source | Admitting: Family Medicine

## 2017-07-13 ENCOUNTER — Encounter: Payer: Self-pay | Admitting: Family Medicine

## 2017-07-13 ENCOUNTER — Telehealth: Payer: Self-pay

## 2017-07-13 VITALS — BP 132/88 | HR 68 | Temp 98.2°F | Resp 16 | Ht 67.0 in | Wt 211.0 lb

## 2017-07-13 DIAGNOSIS — N921 Excessive and frequent menstruation with irregular cycle: Secondary | ICD-10-CM

## 2017-07-13 DIAGNOSIS — G43809 Other migraine, not intractable, without status migrainosus: Secondary | ICD-10-CM

## 2017-07-13 LAB — CBC WITH DIFFERENTIAL/PLATELET
BASOS PCT: 0.6 %
Basophils Absolute: 43 cells/uL (ref 0–200)
Eosinophils Absolute: 99 cells/uL (ref 15–500)
Eosinophils Relative: 1.4 %
HEMATOCRIT: 36.3 % (ref 35.0–45.0)
HEMOGLOBIN: 12 g/dL (ref 11.7–15.5)
Lymphs Abs: 2854 cells/uL (ref 850–3900)
MCH: 31 pg (ref 27.0–33.0)
MCHC: 33.1 g/dL (ref 32.0–36.0)
MCV: 93.8 fL (ref 80.0–100.0)
MONOS PCT: 6.5 %
MPV: 9.9 fL (ref 7.5–12.5)
NEUTROS ABS: 3642 {cells}/uL (ref 1500–7800)
Neutrophils Relative %: 51.3 %
Platelets: 379 10*3/uL (ref 140–400)
RBC: 3.87 10*6/uL (ref 3.80–5.10)
RDW: 12.3 % (ref 11.0–15.0)
Total Lymphocyte: 40.2 %
WBC mixed population: 462 cells/uL (ref 200–950)
WBC: 7.1 10*3/uL (ref 3.8–10.8)

## 2017-07-13 MED ORDER — KETOPROFEN 50 MG PO CAPS: 50.0000 mg | ORAL_CAPSULE | Freq: Four times a day (QID) | ORAL | 0 refills | Status: DC | PRN

## 2017-07-13 MED ORDER — BUTALBITAL-ASA-CAFFEINE 50-325-40 MG PO CAPS: 1.0000 | ORAL_CAPSULE | Freq: Two times a day (BID) | ORAL | 0 refills | Status: DC | PRN

## 2017-07-13 NOTE — Telephone Encounter (Signed)
Patient walked into office and wanted her bp checked, Manually it was 122/82. Patient was complaining of headache. Denies nausea/blurry vision/dizziness. I asked that patient schedule an appointment asap if problem persists.

## 2017-07-13 NOTE — Patient Instructions (Addendum)
  Fioricet twice daily as needed for migraine Ketoprofen 50 mg every 6 hours as needed for moderate to severe headache pain Increase rest in dark, quiet room!!!!!!!   Migraine Headache A migraine headache is a very strong throbbing pain on one side or both sides of your head. Migraines can also cause other symptoms. Talk with your doctor about what things may bring on (trigger) your migraine headaches. Follow these instructions at home: Medicines  Take over-the-counter and prescription medicines only as told by your doctor.  Do not drive or use heavy machinery while taking prescription pain medicine.  To prevent or treat constipation while you are taking prescription pain medicine, your doctor may recommend that you: ? Drink enough fluid to keep your pee (urine) clear or pale yellow. ? Take over-the-counter or prescription medicines. ? Eat foods that are high in fiber. These include fresh fruits and vegetables, whole grains, and beans. ? Limit foods that are high in fat and processed sugars. These include fried and sweet foods. Lifestyle  Avoid alcohol.  Do not use any products that contain nicotine or tobacco, such as cigarettes and e-cigarettes. If you need help quitting, ask your doctor.  Get at least 8 hours of sleep every night.  Limit your stress. General instructions   Keep a journal to find out what may bring on your migraines. For example, write down: ? What you eat and drink. ? How much sleep you get. ? Any change in what you eat or drink. ? Any change in your medicines.  If you have a migraine: ? Avoid things that make your symptoms worse, such as bright lights. ? It may help to lie down in a dark, quiet room. ? Do not drive or use heavy machinery. ? Ask your doctor what activities are safe for you.  Keep all follow-up visits as told by your doctor. This is important. Contact a doctor if:  You get a migraine that is different or worse than your usual  migraines. Get help right away if:  Your migraine gets very bad.  You have a fever.  You have a stiff neck.  You have trouble seeing.  Your muscles feel weak or like you cannot control them.  You start to lose your balance a lot.  You start to have trouble walking.  You pass out (faint). This information is not intended to replace advice given to you by your health care provider. Make sure you discuss any questions you have with your health care provider. Document Released: 07/18/2008 Document Revised: 04/28/2016 Document Reviewed: 03/27/2016 Elsevier Interactive Patient Education  2017 [employer].   The patient was given clear instructions to go to ER or return to medical center if symptoms do not improve, worsen or new problems develop. The patient verbalized understanding. Will notify patient with laboratory results.

## 2017-07-13 NOTE — Progress Notes (Signed)
Headache Victoria Curry, a 51 year old female with a history of migraine headaches presents complaining of worsening headache over the past several days. She says that headache is often triggered during heavy menstrual cycles. She says that headache responded to Ibuprofen and rest on day 1. However, headache has been worsening over the past several days. Headaches are primarily left frontal.  PThe patient rates her most severe headaches a 7 on a scale from 1 to 10. Recently, the headaches have been increasing in both severity and frequency. Work attendance or other daily activities are affected by the headaches. Precipitating factors include: menses. The headaches are usually not preceded by an aura. The patient denies depression, dizziness, loss of balance, muscle weakness, numbness of extremities, speech difficulties, vision problems and vomiting in the early morning. She endorses sensitivity to light and sound. Home treatment has included darkening the room, resting and sleeping with some improvement. Other history includes: migraine headaches diagnosed in the past.  Past Medical History:  Diagnosis Date  . Depression   . Fibromyalgia   . GERD (gastroesophageal reflux disease)   . Hypertension    Social History   Social History  . Marital status: Married    Spouse name: N/A  . Number of children: N/A  . Years of education: N/A   Occupational History  . Not on file.   Social History Main Topics  . Smoking status: Former Research scientist (life sciences)  . Smokeless tobacco: Never Used  . Alcohol use No  . Drug use: No  . Sexual activity: Yes   Other Topics Concern  . Not on file   Social History Narrative  . No narrative on file   Allergies  Allergen Reactions  . Pineapple Swelling  . Adhesive [Tape] Other (See Comments)    Skin irritation   . Codeine Itching  . Latex Other (See Comments)    Skin irritation   . Shellfish Allergy   Review of Systems  Constitutional: Negative.   HENT: Negative.    Eyes: Positive for photophobia. Negative for blurred vision.  Respiratory: Negative.   Cardiovascular: Negative.   Gastrointestinal: Negative.   Genitourinary:       Heavy menses  Musculoskeletal: Negative.   Skin: Negative.   Neurological: Positive for headaches.  Endo/Heme/Allergies: Negative for environmental allergies and polydipsia. Does not bruise/bleed easily.   Physical Exam  Constitutional: She is oriented to person, place, and time and well-developed, well-nourished, and in no distress.  HENT:  Head: Normocephalic and atraumatic.  Right Ear: External ear normal.  Left Ear: External ear normal.  Nose: Nose normal.  Mouth/Throat: Oropharynx is clear and moist.  Eyes: Pupils are equal, round, and reactive to light. Conjunctivae and EOM are normal.  Neck: Normal range of motion. Neck supple.  Cardiovascular: Normal rate, regular rhythm, normal heart sounds and intact distal pulses.   Pulmonary/Chest: Effort normal and breath sounds normal.  Abdominal: Soft. Bowel sounds are normal.  Neurological: She is alert and oriented to person, place, and time. She displays normal reflexes. No cranial nerve deficit. She exhibits normal muscle tone. Coordination normal.  Skin: Skin is warm and dry.   BP 132/88 (BP Location: Right Arm, Patient Position: Sitting, Cuff Size: Large)   Pulse 68   Temp 98.2 F (36.8 C) (Oral)   Resp 16   Ht 5\' 7"  (1.702 m)   Wt 211 lb (95.7 kg)   LMP 07/08/2017   SpO2 100%   BMI 33.05 kg/m    1. Other migraine without  status migrainosus, not intractable Will start a trial of Fiorinal every 12 hours for migraine headache Will also start ketoprofen 50 mg every 6 hours as needed for mild to moderate headache pain.  Maintain a headache diary. Discussed at length  - butalbital-aspirin-caffeine [facility]) 50-325-40 MG capsule; Take 1 capsule by mouth 2 (two) times daily as needed for headache.  Dispense: 30 capsule; Refill: 0 - ketoprofen (ORUDIS) 50 MG  capsule; Take 1 capsule (50 mg total) by mouth 4 (four) times daily as needed.  Dispense: 30 capsule; Refill: 0  2. Menorrhagia with irregular cycle Heavy menses and history of anemia.  Will review CBC with differential and follow up by phone with any abnormal laboratory results She was previously referred to gynecology and missed appointment due to job constraints.  I recommend that she reschedule appointment due to history of uterine leiomyoma and heavy menses.  - CBC with Differential   RTC: 3 months for hypertension   [name]  MSN, FNP-C [facility] [address] [place], [place] [address] [phone]

## 2017-07-13 NOTE — Telephone Encounter (Signed)
Called, no answer. Left a message for patient to call back. Thanks!  

## 2017-07-23 ENCOUNTER — Encounter: Payer: Self-pay | Admitting: Family Medicine

## 2017-07-23 ENCOUNTER — Ambulatory Visit (INDEPENDENT_AMBULATORY_CARE_PROVIDER_SITE_OTHER): Payer: No Typology Code available for payment source | Admitting: Family Medicine

## 2017-07-23 DIAGNOSIS — G43809 Other migraine, not intractable, without status migrainosus: Secondary | ICD-10-CM

## 2017-07-23 MED ORDER — KETOPROFEN 50 MG PO CAPS: 50.0000 mg | ORAL_CAPSULE | Freq: Four times a day (QID) | ORAL | 1 refills | Status: DC | PRN

## 2017-07-23 MED ORDER — BUTALBITAL-ASA-CAFFEINE 50-325-40 MG PO CAPS: 1.0000 | ORAL_CAPSULE | Freq: Two times a day (BID) | ORAL | 3 refills | Status: DC | PRN

## 2017-07-23 NOTE — Progress Notes (Signed)
Headache Victoria Curry, a 51 year old female with a history of migraine headaches presents for a follow up. Patient was evaluated in clinic on 07/13/2017 for worsening migraine headaches. She says that headache is often triggered during heavy menstrual cycles. She says that headache responded positively on Fiorinol and Ketoprophen as needed. Laurianne rates current headache pain as 3/10. She last had Fiorinol around 7 am with moderate relief.Precipitating factors include: menses. The headaches are usually not preceded by an aura. The patient denies depression, dizziness, loss of balance, muscle weakness, numbness of extremities, speech difficulties, vision problems and vomiting in the early morning. She endorses sensitivity to light and sound. Home treatment has included darkening the room, resting and sleeping with some improvement. Other history includes: migraine headaches diagnosed in the past.  Past Medical History:  Diagnosis Date  . Depression   . Fibromyalgia   . GERD (gastroesophageal reflux disease)   . Hypertension    Social History   Social History  . Marital status: Married    Spouse name: N/A  . Number of children: N/A  . Years of education: N/A   Occupational History  . Not on file.   Social History Main Topics  . Smoking status: Former Research scientist (life sciences)  . Smokeless tobacco: Never Used  . Alcohol use No  . Drug use: No  . Sexual activity: Yes   Other Topics Concern  . Not on file   Social History Narrative  . No narrative on file   Allergies  Allergen Reactions  . Pineapple Swelling  . Adhesive [Tape] Other (See Comments)    Skin irritation   . Codeine Itching  . Latex Other (See Comments)    Skin irritation   . Shellfish Allergy   Review of Systems  Constitutional: Negative.   HENT: Negative.        Phonophobia  Eyes: Negative for blurred vision.  Respiratory: Negative.   Cardiovascular: Negative.   Gastrointestinal: Negative.   Genitourinary:       Heavy menses   Musculoskeletal: Negative.   Skin: Negative.   Endo/Heme/Allergies: Negative for environmental allergies and polydipsia. Does not bruise/bleed easily.   Physical Exam  Constitutional: She is oriented to person, place, and time and well-developed, well-nourished, and in no distress.  HENT:  Head: Normocephalic and atraumatic.  Right Ear: External ear normal.  Left Ear: External ear normal.  Nose: Nose normal.  Mouth/Throat: Oropharynx is clear and moist.  Eyes: Pupils are equal, round, and reactive to light. Conjunctivae and EOM are normal.  Neck: Normal range of motion. Neck supple.  Cardiovascular: Normal rate, regular rhythm, normal heart sounds and intact distal pulses.   Pulmonary/Chest: Effort normal and breath sounds normal.  Abdominal: Soft. Bowel sounds are normal.  Neurological: She is alert and oriented to person, place, and time. She displays normal reflexes. No cranial nerve deficit. She exhibits normal muscle tone. Coordination normal.  Skin: Skin is warm and dry.   BP 122/82 (BP Location: Right Arm, Patient Position: Sitting, Cuff Size: Normal) Comment: manually  Pulse 87   Temp 98.2 F (36.8 C)   Resp 14   Ht 5\' 7"  (1.702 m)   Wt 210 lb (95.3 kg)   LMP 07/08/2017   SpO2 100%   BMI 32.89 kg/m   Other migraine without status migrainosus, not intractable Will start a trial of Fiorinal every 12 hours for migraine headache Will also start ketoprofen 50 mg every 6 hours as needed for mild to moderate headache pain.  Maintain  a headache diary. Discussed at length - butalbital-aspirin-caffeine [facility]) 50-325-40 MG capsule; Take 1 capsule by mouth 2 (two) times daily as needed for headache.  Dispense: 30 capsule; Refill: 3 - ketoprofen (ORUDIS) 50 MG capsule; Take 1 capsule (50 mg total) by mouth 4 (four) times daily as needed.  Dispense: 30 capsule; Refill: 1  RTC: 3 months for hypertension   [name]  MSN, FNP-C [facility] [address] [place], [place] [address] [phone]

## 2017-07-23 NOTE — Patient Instructions (Signed)

## 2017-07-26 MED FILL — ?DULOXETINE HCL DR 30 MG CA: 30 MG | 30 days supply | Qty: 60 | Fill #1 | Status: TO

## 2017-08-03 ENCOUNTER — Ambulatory Visit (INDEPENDENT_AMBULATORY_CARE_PROVIDER_SITE_OTHER): Payer: No Typology Code available for payment source | Admitting: Obstetrics & Gynecology

## 2017-08-03 ENCOUNTER — Encounter: Payer: Self-pay | Admitting: Obstetrics & Gynecology

## 2017-08-03 DIAGNOSIS — D259 Leiomyoma of uterus, unspecified: Secondary | ICD-10-CM

## 2017-08-03 DIAGNOSIS — N920 Excessive and frequent menstruation with regular cycle: Secondary | ICD-10-CM | POA: Insufficient documentation

## 2017-08-03 MED ORDER — IBUPROFEN 600 MG PO TABS: 600.0000 mg | ORAL_TABLET | Freq: Three times a day (TID) | ORAL | 2 refills | Status: DC | PRN

## 2017-08-03 NOTE — Progress Notes (Signed)
Patient ID: Victoria Curry, female   DOB: [birth year], 51 y.o.   MRN: [id number]  Chief Complaint  Patient presents with  . Referral    new patient, ueterine leiomyoma   Heavy periods HPI Victoria Curry is a 51 y.o. female.  Curry Patient's last menstrual period was 07/08/2017. Her last 3 periods have been heavier and more frequent with cycles about 21 days, lasting 3-4 days and using 2-4 overnight pads daily. Some cramps but not taking any medication for this HPI  Past Medical History:  Diagnosis Date  . Depression   . Fibromyalgia   . GERD (gastroesophageal reflux disease)   . Hypertension     Past Surgical History:  Procedure Laterality Date  . BREAST LUMPECTOMY      Family History  Problem Relation Age of Onset  . Diabetes Mother   . Hypertension Mother   . Alzheimer's disease Mother   . Cancer Mother   . Heart disease Mother   . Hypertension Father   . Heart disease Father   . Cancer Father        testiculiar     Social History Social History  Substance Use Topics  . Smoking status: Former Research scientist (life sciences)  . Smokeless tobacco: Never Used  . Alcohol use No    Allergies  Allergen Reactions  . Pineapple Swelling  . Adhesive [Tape] Other (See Comments)    Skin irritation   . Codeine Itching  . Latex Other (See Comments)    Skin irritation   . Shellfish Allergy     Current Outpatient Prescriptions  Medication Sig Dispense Refill  . amLODipine (NORVASC) 10 MG tablet TAKE 1 TABLET (10 MG TOTAL) BY MOUTH DAILY. 30 tablet 2  . Biotin 1000 MCG tablet Take 1,000 mcg by mouth 3 (three) times daily.    . busPIRone (BUSPAR) 5 MG tablet TAKE 1 TABLET (5 MG TOTAL) BY MOUTH 2 (TWO) TIMES DAILY. 60 tablet 1  . butalbital-aspirin-caffeine (FIORINAL) 50-325-40 MG capsule Take 1 capsule by mouth 2 (two) times daily as needed for headache. 30 capsule 3  . DULoxetine (CYMBALTA) 30 MG capsule Take 1 capsule (30 mg total) by mouth 2 (two) times daily. 60 capsule 5  . ferrous  fumarate (HEMOCYTE - 106 MG FE) 325 (106 Fe) MG TABS tablet Take 1 tablet (106 mg of iron total) by mouth daily. 30 each 1  . hydrOXYzine (ATARAX/VISTARIL) 25 MG tablet Take 25 mg by mouth 3 (three) times daily as needed.    Marland Kitchen ketoprofen (ORUDIS) 50 MG capsule Take 1 capsule (50 mg total) by mouth 4 (four) times daily as needed. 30 capsule 1  . losartan (COZAAR) 100 MG tablet Take 1 tablet (100 mg total) by mouth daily. Reported on 10/08/2015 30 tablet 5  . ondansetron (ZOFRAN-ODT) 8 MG disintegrating tablet Take 1 tablet (8 mg total) by mouth every 8 (eight) hours as needed for nausea. 20 tablet 0  . ibuprofen (ADVIL,MOTRIN) 600 MG tablet Take 1 tablet (600 mg total) by mouth every 8 (eight) hours as needed. 30 tablet 2   No current facility-administered medications for this visit.    Family History  Problem Relation Age of Onset  . Diabetes Mother   . Hypertension Mother   . Alzheimer's disease Mother   . Cancer Mother   . Heart disease Mother   . Hypertension Father   . Heart disease Father   . Cancer Father        testiculiar  Review of Systems Review of Systems  Constitutional: Positive for fatigue. Negative for unexpected weight change.  Respiratory: Negative.   Cardiovascular: Negative.   Gastrointestinal: Positive for constipation.  Genitourinary: Positive for dysuria and menstrual problem. Negative for vaginal bleeding and vaginal discharge.  Skin: Negative.   Psychiatric/Behavioral: The patient is nervous/anxious.     Blood pressure (!) 140/93, pulse 87, height 5\' 7"  (1.702 m), weight 209 lb (94.8 kg), last menstrual period 07/08/2017.  Physical Exam Physical Exam  Constitutional: She appears well-developed. No distress.  HENT:  Head: Normocephalic.  Cardiovascular: Normal rate.   Pulmonary/Chest: Effort normal. No respiratory distress.  Skin: No pallor.  Psychiatric: She has a normal mood and affect. Her behavior is normal.  Vitals reviewed.   Data  Reviewed CBC    Component Value Date/Time   WBC 7.1 07/13/2017 1143   RBC 3.87 07/13/2017 1143   HGB 12.0 07/13/2017 1143   HCT 36.3 07/13/2017 1143   PLT 379 07/13/2017 1143   MCV 93.8 07/13/2017 1143   MCH 31.0 07/13/2017 1143   MCHC 33.1 07/13/2017 1143   RDW 12.3 07/13/2017 1143   LYMPHSABS 2,854 07/13/2017 1143   MONOABS 568 03/05/2017 1422   EOSABS 99 07/13/2017 1143   BASOSABS 43 07/13/2017 1143  CLINICAL DATA:  Menorrhagia  EXAM: TRANSABDOMINAL AND TRANSVAGINAL ULTRASOUND OF PELVIS  TECHNIQUE: Study was performed transabdominally to optimize pelvic field of view evaluation and transvaginally to optimize internal visceral architecture evaluation.  COMPARISON:  None  FINDINGS: Uterus  Measurements: 10.1 x 7.8 x 7.1 cm. The uterus as diffusely inhomogeneous echotexture. There is a partially calcified car mass arising from the rightward aspect the uterine fundus measuring 4.7 x 3.7 x 4.4 cm. A second nearby mass in this area measures 4.9 x 2.9 x 3.2 cm. In the left mid uterus, there is a 3.0 x 1.8 x 2.8 cm mass, partially calcified.  Endometrium  Thickness: Endometrium is partially displaced by a leiomyoma. A portion of the endometrium is seen measuring 5 mm in thickness. Endometrium examination is less than optimal.  Right ovary  Measurements: 4.2 x 2.6 x 2.1 cm. Normal appearance/no adnexal mass.  Left ovary  Unable to interrogate with either transabdominal or transvaginal technique. No left-sided masses appreciable.  Other findings  Trace free fluid.  IMPRESSION: Enlarged diffusely leiomyomatous uterus with at least 3 dominant masses. One of these masses distorts the endometrium. Endometrium does not appear appreciably thickened.  Right ovary appears unremarkable. Left ovary could not be seen by either transabdominal or transvaginal technique. No left-sided pelvic mass. Trace free pelvic fluid may be physiologic.  If further  evaluation of the endometrium and/or left ovary is felt to be warranted given clinical symptoms, pelvis MR would be the imaging study of choice for further assessment.   Electronically Signed   By: Lowella Grip III M.D.   On: 04/06/2017 15:05  Images reviewed with the patient Assessment    Patient Active Problem List   Diagnosis Date Noted  . Fibroid uterus 08/03/2017  . Menorrhagia with regular cycle 08/03/2017  . Acute non intractable tension-type headache 12/25/2016  . Nausea 12/25/2016  . Masses of both breasts 12/29/2015  . Absolute anemia 11/13/2015  . Essential hypertension 10/09/2015  . Gastroesophageal reflux disease without esophagitis 10/09/2015  . Metabolic syndrome 41/32/4401  . Fibromyalgia 10/09/2015  . Depression 10/09/2015   Long history of fibroids with worsening menses Likely perimenopausal  Discussed present medication possible interactions with NSAID, reassured her  Plan  Ibuprofen during menses prescribed Menstrual calendar F/U in 4 months Consider IUD or hormonal management 25 min face to face and coordination of care       Emeterio Reeve 08/03/2017, 9:46 AM

## 2017-08-03 NOTE — Progress Notes (Signed)
Patient stated that 3 fibroids where found 1 @ the top of the uterus , 1 @ the back wall of the rectum the other not sure of location . Noted all are 63+ years old & she has had 5 Live Births all 40 weeks. Her cycle flow is 4-6 days using the "overnight absorbency" pads. Iron is low , has a family history of 3 generations of female cancer-- ovarian, uterine, breast.

## 2017-08-03 NOTE — Patient Instructions (Signed)
Uterine Fibroids Uterine fibroids are tissue masses (tumors) that can develop in the womb (uterus). They are also called leiomyomas. This type of tumor is not cancerous (benign) and does not spread to other parts of the body outside of the pelvic area, which is between the hip bones. Occasionally, fibroids may develop in the fallopian tubes, in the cervix, or on the support structures (ligaments) that surround the uterus. You can have one or many fibroids. Fibroids can vary in size, weight, and where they grow in the uterus. Some can become quite large. Most fibroids do not require medical treatment. What are the causes? A fibroid can develop when a single uterine cell keeps growing (replicating). Most cells in the human body have a control mechanism that keeps them from replicating without control. What are the signs or symptoms? Symptoms may include:  Heavy bleeding during your period.  Bleeding or spotting between periods.  Pelvic pain and pressure.  Bladder problems, such as needing to urinate more often (urinary frequency) or urgently.  Inability to reproduce offspring (infertility).  Miscarriages.  How is this diagnosed? Uterine fibroids are diagnosed through a physical exam. Your health care provider may feel the lumpy tumors during a pelvic exam. Ultrasonography and an MRI may be done to determine the size, location, and number of fibroids. How is this treated? Treatment may include:  Watchful waiting. This involves getting the fibroid checked by your health care provider to see if it grows or shrinks. Follow your health care provider's recommendations for how often to have this checked.  Hormone medicines. These can be taken by mouth or given through an intrauterine device (IUD).  Surgery. ? Removing the fibroids (myomectomy) or the uterus (hysterectomy). ? Removing blood supply to the fibroids (uterine artery embolization).  If fibroids interfere with your fertility and you  want to become pregnant, your health care provider may recommend having the fibroids removed. Follow these instructions at home:  Keep all follow-up visits as directed by your health care provider. This is important.  Take over-the-counter and prescription medicines only as told by your health care provider. ? If you were prescribed a hormone treatment, take the hormone medicines exactly as directed.  Ask your health care provider about taking iron pills and increasing the amount of dark green, leafy vegetables in your diet. These actions can help to boost your blood iron levels, which may be affected by heavy menstrual bleeding.  Pay close attention to your period and tell your health care provider about any changes, such as: ? Increased blood flow that requires you to use more pads or tampons than usual per month. ? A change in the number of days that your period lasts per month. ? A change in symptoms that are associated with your period, such as abdominal cramping or back pain. Contact a health care provider if:  You have pelvic pain, back pain, or abdominal cramps that cannot be controlled with medicines.  You have an increase in bleeding between and during periods.  You soak tampons or pads in a half hour or less.  You feel lightheaded, extra tired, or weak. Get help right away if:  You faint.  You have a sudden increase in pelvic pain. This information is not intended to replace advice given to you by your health care provider. Make sure you discuss any questions you have with your health care provider. Document Released: 10/06/2000 Document Revised: 06/08/2016 Document Reviewed: 04/07/2014 Elsevier Interactive Patient Education  2018 [employer]  

## 2017-08-10 ENCOUNTER — Other Ambulatory Visit (INDEPENDENT_AMBULATORY_CARE_PROVIDER_SITE_OTHER): Payer: Self-pay

## 2017-08-10 DIAGNOSIS — Z23 Encounter for immunization: Secondary | ICD-10-CM

## 2017-08-10 DIAGNOSIS — Z111 Encounter for screening for respiratory tuberculosis: Secondary | ICD-10-CM

## 2017-08-10 MED ORDER — HEPATITIS B VAC RECOMBINANT 10 MCG/ML IJ SUSP
1.0000 mL | Freq: Once | INTRAMUSCULAR | Status: AC
  Administered 2017-08-10: 10 ug via INTRAMUSCULAR

## 2017-08-10 NOTE — Progress Notes (Unsigned)
HE

## 2017-08-11 LAB — QUANTIFERON TB GOLD ASSAY (BLOOD)
Mitogen-Nil: >10 IU/mL
QUANTIFERON NIL VALUE: 0.03 [IU]/mL
QUANTIFERON TB AG MINUS NIL: 0.01 [IU]/mL
QUANTIFERON(R)-TB GOLD: NEGATIVE

## 2017-08-17 ENCOUNTER — Ambulatory Visit: Payer: No Typology Code available for payment source | Attending: Internal Medicine

## 2017-08-29 ENCOUNTER — Telehealth: Payer: Self-pay

## 2017-08-29 NOTE — Telephone Encounter (Signed)
Spoke with patient and advised that she will need to come in and be evaluated to see if she has a UTI. Patient verbalized understanding and appointment was scheduled. Thanks!

## 2017-08-31 ENCOUNTER — Telehealth: Payer: Self-pay | Admitting: Obstetrics & Gynecology

## 2017-08-31 NOTE — Telephone Encounter (Signed)
Have questions regarding her cycle

## 2017-09-03 ENCOUNTER — Encounter: Payer: Self-pay | Admitting: Family Medicine

## 2017-09-03 ENCOUNTER — Ambulatory Visit (INDEPENDENT_AMBULATORY_CARE_PROVIDER_SITE_OTHER): Payer: Self-pay | Admitting: Family Medicine

## 2017-09-03 VITALS — BP 137/89 | HR 85 | Temp 98.4°F | Resp 16 | Ht 67.0 in | Wt 208.0 lb

## 2017-09-03 DIAGNOSIS — M549 Dorsalgia, unspecified: Secondary | ICD-10-CM

## 2017-09-03 DIAGNOSIS — N390 Urinary tract infection, site not specified: Secondary | ICD-10-CM

## 2017-09-03 LAB — POCT URINALYSIS DIP (DEVICE)
GLUCOSE, UA: NEGATIVE mg/dL
Nitrite: POSITIVE — AB
Protein, ur: NEGATIVE mg/dL
SPECIFIC GRAVITY, URINE: 1.025 (ref 1.005–1.030)
Urobilinogen, UA: 1 mg/dL (ref 0.0–1.0)
pH: 6.5 (ref 5.0–8.0)

## 2017-09-03 MED ORDER — CIPROFLOXACIN HCL 250 MG PO TABS: 250.0000 mg | ORAL_TABLET | Freq: Two times a day (BID) | ORAL | 0 refills | Status: AC

## 2017-09-03 MED FILL — CIPROFLOXACIN HCL 250 MG TA: 250 | 7 days supply | Qty: 14 | Fill #0

## 2017-09-03 NOTE — Patient Instructions (Signed)
Urinalysis showed positive nitrites, which is consistent with a urinary tract infection.  Will treat empirically with a broad-spectrum antibiotic start ciprofloxacin 250 mg twice a day for 7 days.  Increase daily water intake to 6-8 glasses, add cranberry juice to acidify urine, white from front to back, practice good perianal hygiene, and void after sexual intercourse.  Please follow-up in office if symptoms do not improve within the next 5-7 days.

## 2017-09-03 NOTE — Progress Notes (Signed)
Subjective:    Patient ID: Victoria Curry, female    DOB: 1966-07-03, 51 y.o.   MRN: [id number]  HPI  Samone Guhl, a 51 year old female with a history of hypertension presents complaining of urinary odor and urinary frequency.  Symptoms of been occurring over the past 2 weeks.  She states that she has increased water intake and added cranberry juice without satisfactory results.  Patient denies fever, fatigue, abdominal pain, dysuria, urinary retention, constipation, or incontinence. Past Medical History:  Diagnosis Date  . Depression   . Fibromyalgia   . GERD (gastroesophageal reflux disease)   . Hypertension    Social History   Socioeconomic History  . Marital status: Married    Spouse name: Not on file  . Number of children: Not on file  . Years of education: Not on file  . Highest education level: Not on file  Social Needs  . Financial resource strain: Not on file  . Food insecurity - worry: Not on file  . Food insecurity - inability: Not on file  . Transportation needs - medical: Not on file  . Transportation needs - non-medical: Not on file  Occupational History  . Not on file  Tobacco Use  . Smoking status: Former Research scientist (life sciences)  . Smokeless tobacco: Never Used  Substance and Sexual Activity  . Alcohol use: No  . Drug use: No  . Sexual activity: Yes  Other Topics Concern  . Not on file  Social History Narrative  . Not on file   Immunization History  Administered Date(s) Administered  . Hepatitis B, adult 08/10/2017  . Influenza,inj,Quad PF,6+ Mos 11/11/2015, 08/11/2016, 08/10/2017  . Tdap 08/11/2016     Review of Systems  Constitutional: Negative.   HENT: Negative.   Respiratory: Negative.   Cardiovascular: Negative.  Negative for chest pain, palpitations and leg swelling.  Gastrointestinal: Negative.   Genitourinary: Positive for frequency and urgency. Negative for flank pain.  Allergic/Immunologic: Negative.   Neurological: Negative.   Hematological:  Negative.   Psychiatric/Behavioral: Negative.        Objective:   Physical Exam  Constitutional: She is oriented to person, place, and time.  HENT:  Head: Normocephalic.  Right Ear: External ear normal.  Left Ear: External ear normal.  Mouth/Throat: Oropharynx is clear and moist.  Eyes: EOM are normal. Pupils are equal, round, and reactive to light.  Neck: Normal range of motion.  Abdominal: Soft. There is CVA tenderness.  Musculoskeletal: Normal range of motion.  Neurological: She is alert and oriented to person, place, and time.  Skin: Skin is warm and dry.  Psychiatric: She has a normal mood and affect. Her behavior is normal. Judgment and thought content normal.         BP 137/89   Pulse 85   Temp 98.4 F (36.9 C) (Oral)   Resp 16   Ht 5\' 7"  (1.702 m)   Wt 208 lb (94.3 kg)   LMP 08/28/2017   SpO2 100%   BMI 32.58 kg/m  Assessment & Plan:  1. Urinary tract infection without hematuria, site unspecified Patient has an abnormal urinalysis.  Positive nitrites are present.  Will treat empirically with broad-spectrum antibiotic based on current symptoms and urinalysis.  Will start ciprofloxacin 250 mg twice daily for 7 days.  Will follow urine culture. - POCT urinalysis dip (device) - ciprofloxacin (CIPRO) 250 MG tablet; Take 1 tablet (250 mg total) 2 (two) times daily for 7 days by mouth.  Dispense: 14 tablet; Refill: 0 -  Urine Culture  2. CVA tenderness - POCT urinalysis dip (device) - Urine Culture   RTC: As previously scheduled  The patient was given clear instructions to go to ER or return to medical center if symptoms do not improve, worsen or new problems develop. The patient verbalized understanding. Will notify patient with laboratory results.   [name]  MSN, FNP-C [facility] [address] [place], [place] [address] [phone]

## 2017-09-05 NOTE — Telephone Encounter (Signed)
Received voicemail message left on nurse line on 09/04/17 at 1622.  Patient states she has been seen for heavy bleeding and would like to move forward with birth control pills or an IUD to assist with decreasing her bleeding.  States her periods are still 21 days apart.  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

## 2017-09-06 ENCOUNTER — Other Ambulatory Visit: Payer: Self-pay | Admitting: Family Medicine

## 2017-09-06 ENCOUNTER — Other Ambulatory Visit: Payer: Self-pay | Admitting: Obstetrics & Gynecology

## 2017-09-06 ENCOUNTER — Telehealth: Payer: Self-pay | Admitting: General Practice

## 2017-09-06 DIAGNOSIS — I10 Essential (primary) hypertension: Secondary | ICD-10-CM

## 2017-09-06 DIAGNOSIS — N920 Excessive and frequent menstruation with regular cycle: Secondary | ICD-10-CM

## 2017-09-06 LAB — URINE CULTURE
MICRO NUMBER:: 81271617
SPECIMEN QUALITY: ADEQUATE

## 2017-09-06 MED ORDER — MEGESTROL ACETATE 40 MG PO TABS: 40.0000 mg | ORAL_TABLET | Freq: Two times a day (BID) | ORAL | 3 refills | Status: DC

## 2017-09-06 MED FILL — ?DULOXETINE HCL DR 30 MG CA: 30 MG | 30 days supply | Qty: 60 | Fill #2 | Status: TO

## 2017-09-06 NOTE — Telephone Encounter (Signed)
Spoke with Dr Roselie Awkward who prescribed megace for patient & requests patient come back for an appt to be seen. Called patient, no answer- left message stating we are trying to reach you to return your phone call and we have received your message. Dr Roselie Awkward has [facility] to [facility] 1 pill twice a day to control your bleeding. He would also like for you to come back for an appt to be seen. Someone from our front office staff will be contacting you to schedule an appt. You may call us back if you have questions

## 2017-09-06 NOTE — Telephone Encounter (Signed)
Patient called and left message stating she is having heavy bleeding and wants to talk to a doctor or get birth control pills to control this.

## 2017-09-10 ENCOUNTER — Ambulatory Visit: Payer: No Typology Code available for payment source | Admitting: Family Medicine

## 2017-09-11 ENCOUNTER — Ambulatory Visit (INDEPENDENT_AMBULATORY_CARE_PROVIDER_SITE_OTHER): Payer: Self-pay

## 2017-09-11 ENCOUNTER — Ambulatory Visit: Payer: No Typology Code available for payment source | Admitting: Family Medicine

## 2017-09-11 DIAGNOSIS — Z9229 Personal history of other drug therapy: Secondary | ICD-10-CM

## 2017-09-11 MED ORDER — HEPATITIS B VAC RECOMBINANT 10 MCG/ML IJ SUSP
1.0000 mL | Freq: Once | INTRAMUSCULAR | Status: AC
  Administered 2017-09-11: 10 ug via INTRAMUSCULAR

## 2017-09-27 ENCOUNTER — Ambulatory Visit: Payer: No Typology Code available for payment source | Admitting: Obstetrics & Gynecology

## 2017-10-11 MED FILL — ?DULOXETINE HCL DR 30 MG CA: 30 MG | 30 days supply | Qty: 60 | Fill #3 | Status: TO

## 2017-10-12 ENCOUNTER — Other Ambulatory Visit: Payer: Self-pay | Admitting: Family Medicine

## 2017-10-12 DIAGNOSIS — I10 Essential (primary) hypertension: Secondary | ICD-10-CM

## 2017-10-15 ENCOUNTER — Other Ambulatory Visit: Payer: Self-pay | Admitting: Family Medicine

## 2017-10-18 ENCOUNTER — Other Ambulatory Visit: Payer: Self-pay

## 2017-10-18 ENCOUNTER — Encounter: Payer: Self-pay | Admitting: Family Medicine

## 2017-10-18 ENCOUNTER — Ambulatory Visit (INDEPENDENT_AMBULATORY_CARE_PROVIDER_SITE_OTHER): Payer: Self-pay | Admitting: Family Medicine

## 2017-10-18 VITALS — BP 130/90 | HR 97 | Temp 98.7°F | Resp 16 | Ht 67.0 in | Wt 210.0 lb

## 2017-10-18 DIAGNOSIS — F419 Anxiety disorder, unspecified: Secondary | ICD-10-CM

## 2017-10-18 DIAGNOSIS — F329 Major depressive disorder, single episode, unspecified: Secondary | ICD-10-CM

## 2017-10-18 MED ORDER — BUSPIRONE HCL 5 MG PO TABS: 5.0000 mg | ORAL_TABLET | Freq: Two times a day (BID) | ORAL | 1 refills | Status: DC

## 2017-10-21 NOTE — Progress Notes (Signed)
No appointment required. Patient will follow up for chronic conditions in 1 month.   [name]  MSN, FNP-C [facility] [address] [place], [place] [address] [phone]

## 2017-10-24 ENCOUNTER — Ambulatory Visit: Payer: No Typology Code available for payment source | Admitting: Family Medicine

## 2017-10-24 ENCOUNTER — Ambulatory Visit: Payer: BLUE CROSS/BLUE SHIELD | Admitting: Family Medicine

## 2017-10-24 ENCOUNTER — Encounter: Payer: Self-pay | Admitting: Family Medicine

## 2017-10-24 VITALS — BP 138/79 | HR 93 | Temp 98.3°F | Resp 16 | Ht 67.0 in | Wt 213.0 lb

## 2017-10-24 DIAGNOSIS — I10 Essential (primary) hypertension: Secondary | ICD-10-CM

## 2017-10-24 DIAGNOSIS — F419 Anxiety disorder, unspecified: Secondary | ICD-10-CM

## 2017-10-24 DIAGNOSIS — F329 Major depressive disorder, single episode, unspecified: Secondary | ICD-10-CM | POA: Diagnosis not present

## 2017-10-24 DIAGNOSIS — F32A Depression, unspecified: Secondary | ICD-10-CM

## 2017-10-24 LAB — POCT URINALYSIS DIP (DEVICE)
BILIRUBIN URINE: NEGATIVE
GLUCOSE, UA: NEGATIVE mg/dL
KETONES UR: NEGATIVE mg/dL
Leukocytes, UA: NEGATIVE
NITRITE: NEGATIVE
Protein, ur: NEGATIVE mg/dL
Specific Gravity, Urine: 1.01 (ref 1.005–1.030)
Urobilinogen, UA: 0.2 mg/dL (ref 0.0–1.0)
pH: 7 (ref 5.0–8.0)

## 2017-10-24 MED ORDER — BUSPIRONE HCL 10 MG PO TABS: 10.0000 mg | ORAL_TABLET | Freq: Two times a day (BID) | ORAL | 5 refills | Status: DC

## 2017-10-24 NOTE — Patient Instructions (Signed)
Will increase Buspar to 10 mg BID. Your blood pressure is at goal on current medication regimen. No medication changes warranted for hypertension. We have discussed target BP range and blood pressure goal. I have advised patient to check BP regularly and to call us back or report to clinic if the numbers are consistently higher than 140/90. We discussed the importance of compliance with medical therapy and DASH diet recommended, consequences of uncontrolled hypertension discussed.  - continue current BP medications Buspirone tablets What is this medicine? BUSPIRONE (byoo SPYE rone) is used to treat anxiety disorders. This medicine may be used for other purposes; ask your health care provider or pharmacist if you have questions. COMMON BRAND NAME(S): BuSpar What should I tell my health care provider before I take this medicine? They need to know if you have any of these conditions: -kidney or liver disease -an unusual or allergic reaction to buspirone, other medicines, foods, dyes, or preservatives -pregnant or trying to get pregnant -breast-feeding How should I use this medicine? Take this medicine by mouth with a glass of water. Follow the directions on the prescription label. You may take this medicine with or without food. To ensure that this medicine always works the same way for you, you should take it either always with or always without food. Take your doses at regular intervals. Do not take your medicine more often than directed. Do not stop taking except on the advice of your doctor or health care professional. Talk to your pediatrician regarding the use of this medicine in children. Special care may be needed. Overdosage: If you think you have taken too much of this medicine contact a poison control center or emergency room at once. NOTE: This medicine is only for you. Do not share this medicine with others. What if I miss a dose? If you miss a dose, take it as soon as you can. If it  is almost time for your next dose, take only that dose. Do not take double or extra doses. What may interact with this medicine? Do not take this medicine with any of the following medications: -linezolid -MAOIs like Carbex, Eldepryl, Marplan, Nardil, and Parnate -methylene blue -procarbazine This medicine may also interact with the following medications: -diazepam -digoxin -diltiazem -erythromycin -grapefruit juice -haloperidol -medicines for mental depression or mood problems -medicines for seizures like carbamazepine, phenobarbital and phenytoin -nefazodone -other medications for anxiety -rifampin -ritonavir -some antifungal medicines like itraconazole, ketoconazole, and voriconazole -verapamil -warfarin This list may not describe all possible interactions. Give your health care provider a list of all the medicines, herbs, non-prescription drugs, or dietary supplements you use. Also tell them if you smoke, drink alcohol, or use illegal drugs. Some items may interact with your medicine. What should I watch for while using this medicine? Visit your doctor or health care professional for regular checks on your progress. It may take 1 to 2 weeks before your anxiety gets better. You may get drowsy or dizzy. Do not drive, use machinery, or do anything that needs mental alertness until you know how this drug affects you. Do not stand or sit up quickly, especially if you are an older patient. This reduces the risk of dizzy or fainting spells. Alcohol can make you more drowsy and dizzy. Avoid alcoholic drinks. What side effects may I notice from receiving this medicine? Side effects that you should report to your doctor or health care professional as soon as possible: -blurred vision or other vision changes -chest pain -confusion -difficulty  breathing -feelings of hostility or anger -muscle aches and pains -numbness or tingling in hands or feet -ringing in the ears -skin rash and  itching -vomiting -weakness Side effects that usually do not require medical attention (report to your doctor or health care professional if they continue or are bothersome): -disturbed dreams, nightmares -headache -nausea -restlessness or nervousness -sore throat and nasal congestion -stomach upset This list may not describe all possible side effects. Call your doctor for medical advice about side effects. You may report side effects to FDA at 1-800-FDA-1088. Where should I keep my medicine? Keep out of the reach of children. Store at room temperature below 30 degrees C (86 degrees F). Protect from light. Keep container tightly closed. Throw away any unused medicine after the expiration date. NOTE: This sheet is a summary. It may not cover all possible information. If you have questions about this medicine, talk to your doctor, pharmacist, or health care provider.  2018 Elsevier/Gold Standard (2010-05-19 18:06:11)

## 2017-10-24 NOTE — Progress Notes (Signed)
[name], a 52 year old female presents for a  3 month follow up of hypertension and anxiety. She says that symptoms of anxiety and depression have improved some since starting Buspar 5 mg twice daily. She has also been participating in counseling session with her spouse. She endorses occasional depressed mood and anxiety. She denies feelings of anhedonia, racing thoughts, hopelessness, or suicidal/homicidal ideations.    Depression       (Patient is going through a separation after 5 years of marriage. )  This is a chronic problem.   Pertinent negatives include no thyroid problem, no anxiety and no suicide attempts. Anxiety  Presents for initial visit. Patient reports no chest pain, feeling of choking, nervous/anxious behavior, palpitations or shortness of breath. Primary symptoms comment: Patient is going through a separation after 5 years of marriage. . Symptoms occur constantly. The severity of symptoms is mild. The symptoms are aggravated by family issues and work stress. The quality of sleep is poor. Nighttime awakenings: several.   Risk factors include a major life event. Her past medical history is significant for anxiety/panic attacks. There is no history of suicide attempts. Past treatments include counseling (CBT) (Spiritual counseling at church. ).  Hypertension  The problem has been rapidly improving since onset. Pertinent negatives include no anxiety, chest pain, malaise/fatigue, neck pain, palpitations or shortness of breath. There is no history of sleep apnea or a thyroid problem.    Past Medical History:  Diagnosis Date  . Depression   . Fibromyalgia   . GERD (gastroesophageal reflux disease)   . Hypertension    Social History   Socioeconomic History  . Marital status: Married    Spouse name: Not on file  . Number of children: Not on file  . Years of education: Not on file  . Highest education level: Not on file  Social Needs  . Financial resource strain: Not  on file  . Food insecurity - worry: Not on file  . Food insecurity - inability: Not on file  . Transportation needs - medical: Not on file  . Transportation needs - non-medical: Not on file  Occupational History  . Not on file  Tobacco Use  . Smoking status: Former Research scientist (life sciences)  . Smokeless tobacco: Never Used  Substance and Sexual Activity  . Alcohol use: No  . Drug use: No  . Sexual activity: Yes  Other Topics Concern  . Not on file  Social History Narrative  . Not on file   Immunization History  Administered Date(s) Administered  . Hepatitis B, adult 08/10/2017, 09/11/2017  . Influenza,inj,Quad PF,6+ Mos 11/11/2015, 08/11/2016, 08/10/2017  . Tdap 08/11/2016   Allergies  Allergen Reactions  . Pineapple Swelling  . Adhesive [Tape] Other (See Comments)    Skin irritation   . Codeine Itching  . Latex Other (See Comments)    Skin irritation   . Shellfish Allergy    Review of Systems  Constitutional: Negative.  Negative for diaphoresis and malaise/fatigue.  HENT: Negative.   Eyes: Negative.   Respiratory: Negative for shortness of breath.   Cardiovascular: Negative for chest pain and palpitations.  Gastrointestinal: Negative.   Genitourinary: Negative.   Musculoskeletal: Negative for neck pain.  Skin: Negative.   Neurological: Negative.  Negative for weakness.  Endo/Heme/Allergies: Negative.   Psychiatric/Behavioral: Positive for depression. The patient is not nervous/anxious.        Anxiety  Physical Exam  Constitutional: She is oriented to person, place, and time and well-developed, well-nourished, and  in no distress.  HENT:  Head: Normocephalic and atraumatic.  Right Ear: External ear normal.  Left Ear: External ear normal.  Nose: Nose normal.  Mouth/Throat: Oropharynx is clear and moist.  Eyes: Conjunctivae and EOM are normal. Pupils are equal, round, and reactive to light.  Neck: Normal range of motion. Neck supple.  Cardiovascular: Normal rate, regular rhythm,  normal heart sounds and intact distal pulses.  Pulmonary/Chest: Effort normal and breath sounds normal.  Abdominal: Soft. Bowel sounds are normal.  Musculoskeletal: Normal range of motion.  Neurological: She is alert and oriented to person, place, and time. Gait normal.  Skin: Skin is warm and dry.  Psychiatric: Mood, memory, affect and judgment normal.  Plan  BP 138/79 (BP Location: Left Arm, Patient Position: Sitting, Cuff Size: Large)   Pulse 93   Temp 98.3 F (36.8 C) (Oral)   Resp 16   Ht 5\' 7"  (1.702 m)   Wt 213 lb (96.6 kg)   LMP 10/22/2017   SpO2 100%   BMI 33.36 kg/m \  Depression, unspecified depression type Depression screen [facility] 2/9 10/24/2017 10/18/2017 09/03/2017 07/13/2017 04/03/2017  Decreased Interest 1 3 0 0 0  Down, Depressed, Hopeless 2 3 0 0 0  PHQ - 2 Score 3 6 0 0 0  Altered sleeping - 1 - - -  Tired, decreased energy - 1 - - -  Change in appetite - 1 - - -  Feeling bad or failure about yourself  - 3 - - -  Trouble concentrating - 1 - - -  Moving slowly or fidgety/restless - 0 - - -  Suicidal thoughts - 1 - - -  PHQ-9 Score - 14 - - -  Difficult doing work/chores - - - - -  Denies suicidal or homicidal ideations. Will continue Cymbalta 30 mg BID  Anxiety Will increase Buspar to 10 mg twice daily.  GAD 7 : Generalized Anxiety Score 10/24/2017 04/03/2017 03/07/2017 03/05/2017  Nervous, Anxious, on Edge 2 1 - 3  Control/stop worrying 2 0 - 1  Worry too much - different things 2 0 - 2  Trouble relaxing 1 0 - 1  Restless 2 0 - 1  Easily annoyed or irritable 1 0 1 -  Afraid - awful might happen 2 1 1  -  Total GAD 7 Score 12 2 - -  Anxiety Difficulty Somewhat difficult Not difficult at all Somewhat difficult -     Essential hypertension Blood pressure at goal on current medication regimen.  No medication changes warranted on today Continue medication, monitor blood pressure at home. Continue DASH diet.  Reminded to go to the ER if any CP, SOB, nausea,  dizziness, severe HA, changes vision/speech, left arm numbness and tingling and jaw pain.       RTC: 3 months for hypertension   [name]  MSN, FNP-C [facility] [address] [place], [place] [address] [phone]

## 2017-10-25 LAB — BASIC METABOLIC PANEL
BUN / CREAT RATIO: 12 (ref 9–23)
BUN: 11 mg/dL (ref 6–24)
CO2: 20 mmol/L (ref 20–29)
Calcium: 9.5 mg/dL (ref 8.7–10.2)
Chloride: 106 mmol/L (ref 96–106)
Creatinine, Ser: 0.9 mg/dL (ref 0.57–1.00)
GFR, EST AFRICAN AMERICAN: 86 mL/min/{1.73_m2} (ref >59)
GFR, EST NON AFRICAN AMERICAN: 74 mL/min/{1.73_m2} (ref >59)
Glucose: 97 mg/dL (ref 65–99)
Potassium: 4.1 mmol/L (ref 3.5–5.2)
SODIUM: 143 mmol/L (ref 134–144)

## 2017-11-12 ENCOUNTER — Other Ambulatory Visit: Payer: Self-pay | Admitting: Family Medicine

## 2017-11-12 DIAGNOSIS — I10 Essential (primary) hypertension: Secondary | ICD-10-CM

## 2017-11-23 MED FILL — DULoxetine HCL 30 MG CPEP: 30 | 30 days supply | Qty: 60 | Fill #4 | Status: TO

## 2017-12-21 ENCOUNTER — Other Ambulatory Visit: Payer: Self-pay | Admitting: Family Medicine

## 2017-12-21 DIAGNOSIS — I10 Essential (primary) hypertension: Secondary | ICD-10-CM

## 2017-12-28 MED FILL — DULoxetine HCL 30 MG CPEP: 30 | 30 days supply | Qty: 60 | Fill #5 | Status: TO

## 2018-01-23 ENCOUNTER — Ambulatory Visit: Payer: No Typology Code available for payment source | Admitting: Family Medicine

## 2018-01-25 ENCOUNTER — Other Ambulatory Visit: Payer: Self-pay | Admitting: Family Medicine

## 2018-01-28 ENCOUNTER — Other Ambulatory Visit: Payer: Self-pay | Admitting: Family Medicine

## 2018-01-28 DIAGNOSIS — I10 Essential (primary) hypertension: Secondary | ICD-10-CM

## 2018-02-04 ENCOUNTER — Other Ambulatory Visit: Payer: Self-pay | Admitting: Family Medicine

## 2018-02-04 DIAGNOSIS — F32A Depression, unspecified: Secondary | ICD-10-CM

## 2018-02-04 DIAGNOSIS — F329 Major depressive disorder, single episode, unspecified: Secondary | ICD-10-CM

## 2018-02-04 MED FILL — DULoxetine HCL 30 MG CPEP: 30 | 30 days supply | Qty: 60 | Fill #0 | Status: TO

## 2018-02-06 ENCOUNTER — Telehealth: Payer: Self-pay

## 2018-02-06 DIAGNOSIS — F419 Anxiety disorder, unspecified: Secondary | ICD-10-CM

## 2018-02-06 DIAGNOSIS — F329 Major depressive disorder, single episode, unspecified: Secondary | ICD-10-CM

## 2018-02-06 MED ORDER — BUSPIRONE HCL 10 MG PO TABS: 10.0000 mg | ORAL_TABLET | Freq: Two times a day (BID) | ORAL | 5 refills | Status: DC

## 2018-02-06 MED ORDER — DULOXETINE HCL 30 MG PO CPEP: 30.0000 mg | ORAL_CAPSULE | Freq: Two times a day (BID) | ORAL | 5 refills | Status: DC

## 2018-02-06 NOTE — Telephone Encounter (Signed)
REFILLS SENT INTO PHARMACY. THANKS!  

## 2018-02-08 ENCOUNTER — Ambulatory Visit: Payer: No Typology Code available for payment source | Admitting: Family Medicine

## 2018-02-12 ENCOUNTER — Telehealth: Payer: Self-pay | Admitting: Obstetrics & Gynecology

## 2018-02-12 DIAGNOSIS — N920 Excessive and frequent menstruation with regular cycle: Secondary | ICD-10-CM

## 2018-02-12 MED ORDER — MEGESTROL ACETATE 40 MG PO TABS: 40.0000 mg | ORAL_TABLET | Freq: Two times a day (BID) | ORAL | 0 refills | Status: DC

## 2018-02-12 NOTE — Telephone Encounter (Signed)
Need a refill on her Rx she forgot the name but its the one that stop her cycle.  [facility] [address]

## 2018-02-12 NOTE — Telephone Encounter (Signed)
Called patient and informed her that I have sent in a one month supply of megage that should get her through until her appt with Dr Ihor Dow next month. She voiced understanding.

## 2018-02-27 ENCOUNTER — Other Ambulatory Visit: Payer: Self-pay | Admitting: Family Medicine

## 2018-02-27 DIAGNOSIS — I10 Essential (primary) hypertension: Secondary | ICD-10-CM

## 2018-03-04 ENCOUNTER — Ambulatory Visit (INDEPENDENT_AMBULATORY_CARE_PROVIDER_SITE_OTHER): Payer: BLUE CROSS/BLUE SHIELD | Admitting: Family Medicine

## 2018-03-04 ENCOUNTER — Encounter: Payer: Self-pay | Admitting: Family Medicine

## 2018-03-04 VITALS — BP 110/72 | HR 83 | Temp 98.7°F | Resp 16 | Ht 67.0 in | Wt 223.0 lb

## 2018-03-04 DIAGNOSIS — R8290 Unspecified abnormal findings in urine: Secondary | ICD-10-CM | POA: Diagnosis not present

## 2018-03-04 DIAGNOSIS — L509 Urticaria, unspecified: Secondary | ICD-10-CM | POA: Diagnosis not present

## 2018-03-04 DIAGNOSIS — F419 Anxiety disorder, unspecified: Secondary | ICD-10-CM

## 2018-03-04 DIAGNOSIS — I10 Essential (primary) hypertension: Secondary | ICD-10-CM | POA: Diagnosis not present

## 2018-03-04 DIAGNOSIS — F329 Major depressive disorder, single episode, unspecified: Secondary | ICD-10-CM | POA: Diagnosis not present

## 2018-03-04 LAB — POCT URINALYSIS DIPSTICK
Bilirubin, UA: NEGATIVE
Glucose, UA: NEGATIVE
NITRITE UA: NEGATIVE
PH UA: 6 (ref 5.0–8.0)
PROTEIN UA: 30
Spec Grav, UA: 1.025 (ref 1.010–1.025)
UROBILINOGEN UA: 0.2 U/dL

## 2018-03-04 MED ORDER — HYDROXYZINE HCL 25 MG PO TABS: 25.0000 mg | ORAL_TABLET | Freq: Three times a day (TID) | ORAL | 0 refills | Status: DC | PRN

## 2018-03-04 MED ORDER — DULOXETINE HCL 30 MG PO CPEP: 30.0000 mg | ORAL_CAPSULE | Freq: Two times a day (BID) | ORAL | 5 refills | Status: DC

## 2018-03-04 MED ORDER — LOSARTAN POTASSIUM 100 MG PO TABS: 100.0000 mg | ORAL_TABLET | Freq: Every day | ORAL | 5 refills | Status: DC

## 2018-03-04 MED ORDER — BUSPIRONE HCL 10 MG PO TABS: 10.0000 mg | ORAL_TABLET | Freq: Three times a day (TID) | ORAL | 5 refills | Status: DC

## 2018-03-04 MED ORDER — AMLODIPINE BESYLATE 10 MG PO TABS: 10.0000 mg | ORAL_TABLET | Freq: Every day | ORAL | 5 refills | Status: DC

## 2018-03-04 MED FILL — hydrOXYzine HCL 25 MG TABS: 25 | 10 days supply | Qty: 30 | Fill #0

## 2018-03-04 MED FILL — DULoxetine HCL 30 MG CPEP: 30 | 30 days supply | Qty: 60 | Fill #0

## 2018-03-04 NOTE — Patient Instructions (Addendum)
Blood pressure is at goal on current medication regimen.  No medication changes warranted on today.  - Continue medication, monitor blood pressure at home. Continue DASH diet. Reminder to go to the ER if any CP, SOB, nausea, dizziness, severe HA, changes vision/speech, left arm numbness and tingling and jaw pain.     Edema Edema is when you have too much fluid in your body or under your skin. Edema may make your legs, feet, and ankles swell up. Swelling is also common in looser tissues, like around your eyes. This is a common condition. It gets more common as you get older. There are many possible causes of edema. Eating too much salt (sodium) and being on your feet or sitting for a long time can cause edema in your legs, feet, and ankles. Hot weather may make edema worse. Edema is usually painless. Your skin may look swollen or shiny. Follow these instructions at home:  Keep the swollen body part raised (elevated) above the level of your heart when you are sitting or lying down.  Do not sit still or stand for a long time.  Do not wear tight clothes. Do not wear garters on your upper legs.  Exercise your legs. This can help the swelling go down.  Wear elastic bandages or support stockings as told by your doctor.  Eat a low-salt (low-sodium) diet to reduce fluid as told by your doctor.  Depending on the cause of your swelling, you may need to limit how much fluid you drink (fluid restriction).  Take over-the-counter and prescription medicines only as told by your doctor. Contact a doctor if:  Treatment is not working.  You have heart, liver, or kidney disease and have symptoms of edema.  You have sudden and unexplained weight gain. Get help right away if:  You have shortness of breath or chest pain.  You cannot breathe when you lie down.  You have pain, redness, or warmth in the swollen areas.  You have heart, liver, or kidney disease and get edema all of a sudden.  You  have a fever and your symptoms get worse all of a sudden. Summary  Edema is when you have too much fluid in your body or under your skin.  Edema may make your legs, feet, and ankles swell up. Swelling is also common in looser tissues, like around your eyes.  Raise (elevate) the swollen body part above the level of your heart when you are sitting or lying down.  Follow your doctor's instructions about diet and how much fluid you can drink (fluid restriction). This information is not intended to replace advice given to you by your health care provider. Make sure you discuss any questions you have with your health care provider. Document Released: 03/27/2008 Document Revised: 10/27/2016 Document Reviewed: 10/27/2016 Elsevier Interactive Patient Education  2017 [employer].

## 2018-03-04 NOTE — Progress Notes (Signed)
[name], a 52 year old female presents for a  3 month follow up of hypertension and anxiety.   Hypertension  The problem has been rapidly improving since onset. Associated symptoms include peripheral edema. Pertinent negatives include no anxiety, chest pain, malaise/fatigue, neck pain, palpitations or shortness of breath. There is no history of kidney disease or heart failure. There is no history of sleep apnea or a thyroid problem.  Depression       (Patient is going through a separation after 5 years of marriage. )  This is a chronic problem.   Pertinent negatives include no thyroid problem, no anxiety and no suicide attempts. Anxiety  Presents for initial visit. Patient reports no chest pain, feeling of choking, nervous/anxious behavior, palpitations or shortness of breath. Primary symptoms comment: Patient is going through a separation after 5 years of marriage. . Symptoms occur constantly. The severity of symptoms is mild. The symptoms are aggravated by family issues and work stress. The quality of sleep is poor. Nighttime awakenings: several.   Risk factors include a major life event. Her past medical history is significant for anxiety/panic attacks. There is no history of suicide attempts. Past treatments include counseling (CBT) (Spiritual counseling at church. ).    Past Medical History:  Diagnosis Date  . Depression   . Fibromyalgia   . GERD (gastroesophageal reflux disease)   . Hypertension    Social History   Socioeconomic History  . Marital status: Married    Spouse name: Not on file  . Number of children: Not on file  . Years of education: Not on file  . Highest education level: Not on file  Occupational History  . Not on file  Social Needs  . Financial resource strain: Not on file  . Food insecurity:    Worry: Not on file    Inability: Not on file  . Transportation needs:    Medical: Not on file    Non-medical: Not on file  Tobacco Use  . Smoking  status: Former Research scientist (life sciences)  . Smokeless tobacco: Never Used  Substance and Sexual Activity  . Alcohol use: No  . Drug use: No  . Sexual activity: Yes  Lifestyle  . Physical activity:    Days per week: Not on file    Minutes per session: Not on file  . Stress: Not on file  Relationships  . Social connections:    Talks on phone: Not on file    Gets together: Not on file    Attends religious service: Not on file    Active member of club or organization: Not on file    Attends meetings of clubs or organizations: Not on file    Relationship status: Not on file  . Intimate partner violence:    Fear of current or ex partner: Not on file    Emotionally abused: Not on file    Physically abused: Not on file    Forced sexual activity: Not on file  Other Topics Concern  . Not on file  Social History Narrative  . Not on file   Immunization History  Administered Date(s) Administered  . Hepatitis B, adult 08/10/2017, 09/11/2017  . Influenza,inj,Quad PF,6+ Mos 11/11/2015, 08/11/2016, 08/10/2017  . Tdap 08/11/2016   Allergies  Allergen Reactions  . Pineapple Swelling  . Adhesive [Tape] Other (See Comments)    Skin irritation   . Codeine Itching  . Latex Other (See Comments)    Skin irritation   . Shellfish Allergy  Review of Systems  Constitutional: Negative.  Negative for diaphoresis and malaise/fatigue.  HENT: Negative.   Eyes: Negative.   Respiratory: Negative for shortness of breath.   Cardiovascular: Negative for chest pain and palpitations.  Gastrointestinal: Negative.   Genitourinary: Negative.   Musculoskeletal: Negative for neck pain.  Skin: Negative.   Neurological: Negative.  Negative for weakness.  Endo/Heme/Allergies: Negative.   Psychiatric/Behavioral: Positive for depression. The patient is not nervous/anxious.        Anxiety  Physical Exam  Constitutional: She is oriented to person, place, and time and well-developed, well-nourished, and in no distress.   HENT:  Head: Normocephalic and atraumatic.  Right Ear: External ear normal.  Left Ear: External ear normal.  Nose: Nose normal.  Mouth/Throat: Oropharynx is clear and moist.  Eyes: Pupils are equal, round, and reactive to light. Conjunctivae and EOM are normal.  Neck: Normal range of motion. Neck supple.  Cardiovascular: Normal rate, regular rhythm, normal heart sounds and intact distal pulses.  Pulmonary/Chest: Effort normal and breath sounds normal.  Abdominal: Soft. Bowel sounds are normal.  Musculoskeletal: Normal range of motion.  Neurological: She is alert and oriented to person, place, and time. Gait normal.  Skin: Skin is warm and dry.  Psychiatric: Mood, memory, affect and judgment normal.  Plan  BP 110/72 (BP Location: Left Arm, Patient Position: Sitting, Cuff Size: Large)   Pulse 83   Temp 98.7 F (37.1 C) (Oral)   Resp 16   Ht 5\' 7"  (1.702 m)   Wt 223 lb (101.2 kg)   SpO2 100%   BMI 34.93 kg/m \ 1. Essential hypertension Blood pressure is at goal on current medication regimen. No medication changes warranted on today.  - Urinalysis Dipstick - losartan (COZAAR) 100 MG tablet; Take 1 tablet (100 mg total) by mouth daily.  Dispense: 30 tablet; Refill: 5 - amLODipine (NORVASC) 10 MG tablet; Take 1 tablet (10 mg total) by mouth daily.  Dispense: 30 tablet; Refill: 5 - Basic Metabolic Panel  2. Abnormal urinalysis Urine leukocytes, will follow urine culture - Urine Culture  3. Anxiety - busPIRone (BUSPAR) 10 MG tablet; Take 1 tablet (10 mg total) by mouth 3 (three) times daily.  Dispense: 90 tablet; Refill: 5  4. Reactive depression - DULoxetine (CYMBALTA) 30 MG capsule; Take 1 capsule (30 mg total) by mouth 2 (two) times daily.  Dispense: 60 capsule; Refill: 5  5. Hives - hydrOXYzine (ATARAX/VISTARIL) 25 MG tablet; Take 1 tablet (25 mg total) by mouth 3 (three) times daily as needed.  Dispense: 30 tablet; Refill: 0   RTC: 3 months for hypertension  [name]  MSN, FNP-C [facility] [address] [place], [place] [address] [phone]

## 2018-03-05 ENCOUNTER — Telehealth: Payer: Self-pay

## 2018-03-05 LAB — BASIC METABOLIC PANEL
BUN/Creatinine Ratio: 16 (ref 9–23)
BUN: 15 mg/dL (ref 6–24)
CALCIUM: 9 mg/dL (ref 8.7–10.2)
CHLORIDE: 107 mmol/L — AB (ref 96–106)
CO2: 17 mmol/L — AB (ref 20–29)
Creatinine, Ser: 0.94 mg/dL (ref 0.57–1.00)
GFR calc Af Amer: 81 mL/min/{1.73_m2} (ref >59)
GFR, EST NON AFRICAN AMERICAN: 70 mL/min/{1.73_m2} (ref >59)
GLUCOSE: 96 mg/dL (ref 65–99)
POTASSIUM: 3.8 mmol/L (ref 3.5–5.2)
SODIUM: 139 mmol/L (ref 134–144)

## 2018-03-05 NOTE — Telephone Encounter (Signed)
-----   Message from Dorena Dew, [name] sent at 03/05/2018  8:55 AM EDT ----- Regarding: lab results Please inform patient that all labs are within a normal range, no further medication changes warranted on today.  Advise patient to follow up in office as scheduled.   [name]  MSN, FNP-C [facility] [address] [place], [place] [address] [phone]

## 2018-03-05 NOTE — Telephone Encounter (Signed)
Called, no answer. Left a message for patient that all labs were normal and if she had any questions to call back to our office. Thanks!

## 2018-03-06 ENCOUNTER — Other Ambulatory Visit: Payer: Self-pay | Admitting: Family Medicine

## 2018-03-06 DIAGNOSIS — N390 Urinary tract infection, site not specified: Secondary | ICD-10-CM

## 2018-03-06 LAB — URINE CULTURE

## 2018-03-06 MED ORDER — AMOXICILLIN 500 MG PO CAPS: 500.0000 mg | ORAL_CAPSULE | Freq: Two times a day (BID) | ORAL | 0 refills | Status: AC

## 2018-03-06 NOTE — Progress Notes (Signed)
Patient called back and I advised that urine yielded bacteria and that she needs to start amoxicillin 500mg  twice daily for 7 days. Patient verbalized understanding. Thanks!

## 2018-03-06 NOTE — Progress Notes (Signed)
Meds ordered this encounter  Medications  . amoxicillin (AMOXIL) 500 MG capsule    Sig: Take 1 capsule (500 mg total) by mouth 2 (two) times daily for 7 days.    Dispense:  14 capsule    Refill:  0     Bellamy Rubey Moore Amarise Lillo  MSN, FNP-C [facility] [address]  [place], [place] [address] [phone]  

## 2018-03-06 NOTE — Progress Notes (Signed)
Called and left a message for patient to call back. Thanks!  

## 2018-03-11 ENCOUNTER — Ambulatory Visit: Payer: BLUE CROSS/BLUE SHIELD | Admitting: Obstetrics & Gynecology

## 2018-03-11 ENCOUNTER — Encounter: Payer: Self-pay | Admitting: Obstetrics & Gynecology

## 2018-03-11 DIAGNOSIS — N920 Excessive and frequent menstruation with regular cycle: Secondary | ICD-10-CM

## 2018-03-11 MED ORDER — NORETHINDRONE ACET-ETHINYL EST 1.5-30 MG-MCG PO TABS: 1.0000 | ORAL_TABLET | Freq: Every day | ORAL | 11 refills | Status: DC

## 2018-03-11 MED ORDER — MEGESTROL ACETATE 40 MG PO TABS: 40.0000 mg | ORAL_TABLET | Freq: Two times a day (BID) | ORAL | 6 refills | Status: DC

## 2018-03-11 NOTE — Progress Notes (Signed)
History:  52 y.o. [id number] here today for managemet of fiborids. Pt reports that the post fibroid is pressing against her rectum, making it difficult to pass a BM.  She reports that she was having heavy bleeding but, it is controlled on Megace. She reports  That she is getting divorced and is considering having another baby. She reports that she is 'opposed to a hysterectomy, complete or partial'   She reports horrible hot flushes that are her worst sx.   The following portions of the patient's history were reviewed and updated as appropriate: allergies, current medications, past family history, past medical history, past social history, past surgical history and problem list.  Review of Systems:  Pertinent items are noted in HPI.    Objective:  Physical Exam Blood pressure 121/85, pulse 91, resp. rate 16, height 5\' 7"  (1.702 m), weight 216 lb 3.2 oz (98.1 kg), last menstrual period 02/22/2018.  CONSTITUTIONAL: Well-developed, well-nourished female in no acute distress.  HENT:  Normocephalic, atraumatic EYES: Conjunctivae and EOM are normal. No scleral icterus.  NECK: Normal range of motion SKIN: Skin is warm and dry. No rash noted. Not diaphoretic.No pallor. [place]: Alert and oriented to person, place, and time. Normal coordination.  Abd: Soft, nontender and nondistended; no masses palpated Pelvic: Normal appearing external genitalia; normal appearing vaginal mucosa.  Normal discharge.  Medium sized uterus- 10 weeks sized. There is a palpable 3cm fized fibrid on the post of her uterus this is slightly tender to palpation . It is not block in gte rectum at all. There are other fibroids palpable but,. No other palpable masses, no uterine or adnexal tenderness  Labs and Imaging 04/06/2017 CLINICAL DATA:  Menorrhagia  EXAM: TRANSABDOMINAL AND TRANSVAGINAL ULTRASOUND OF PELVIS  TECHNIQUE: Study was performed transabdominally to optimize pelvic field of view evaluation and transvaginally  to optimize internal visceral architecture evaluation.  COMPARISON:  None  FINDINGS: Uterus  Measurements: 10.1 x 7.8 x 7.1 cm. The uterus as diffusely inhomogeneous echotexture. There is a partially calcified car mass arising from the rightward aspect the uterine fundus measuring 4.7 x 3.7 x 4.4 cm. A second nearby mass in this area measures 4.9 x 2.9 x 3.2 cm. In the left mid uterus, there is a 3.0 x 1.8 x 2.8 cm mass, partially calcified.  Endometrium  Thickness: Endometrium is partially displaced by a leiomyoma. A portion of the endometrium is seen measuring 5 mm in thickness. Endometrium examination is less than optimal.  Right ovary  Measurements: 4.2 x 2.6 x 2.1 cm. Normal appearance/no adnexal mass.  Left ovary  Unable to interrogate with either transabdominal or transvaginal technique. No left-sided masses appreciable.  Other findings  Trace free fluid.  IMPRESSION: Enlarged diffusely leiomyomatous uterus with at least 3 dominant masses. One of these masses distorts the endometrium. Endometrium does not appear appreciably thickened.  Right ovary appears unremarkable. Left ovary could not be seen by either transabdominal or transvaginal technique. No left-sided pelvic mass. Trace free pelvic fluid may be physiologic.  If further evaluation of the endometrium and/or left ovary is felt to be warranted given clinical symptoms, pelvis MR would be the imaging study of choice for further assessment.  Assessment & Plan:  Uterine fibroids  Pt is perimenopause dn I have explained to her the natural history of fibroids with menopause.   Sx perimenopause  Stop Megace  LoEstrin 1.5/30mcg po q day   F/u in 3 months or sooner prn  Daymion Nazaire L. Harraway-Smith, M.D., Cherlynn June

## 2018-04-09 ENCOUNTER — Telehealth: Payer: Self-pay | Admitting: General Practice

## 2018-04-09 NOTE — Telephone Encounter (Signed)
Patient called and left message on nurse voicemail line stating she is calling regarding her low iron, bleeding, & passing clots dime to quarter size.  Called patient and she states she took OTC iron, ibuprofen, & a couple doses of leftover megace and the bleeding and cramps have finally slowed down. Patient asked if there was anything else she needed to do. Discussed if she feels lightheaded, dizzy, or short of breath she should be seen. Discussed clots are commonly associated with heavy bleeding but aren't something to worry about. Patient verbalized understanding. Discussed if heavy bleeding continues she should see [place] for follow up. Patient verbalized understanding to all & had no questions.

## 2018-04-16 MED FILL — DULoxetine HCL 30 MG CPEP: 30 | 30 days supply | Qty: 60 | Fill #1

## 2018-05-03 ENCOUNTER — Ambulatory Visit (INDEPENDENT_AMBULATORY_CARE_PROVIDER_SITE_OTHER): Payer: BLUE CROSS/BLUE SHIELD | Admitting: Family Medicine

## 2018-05-03 ENCOUNTER — Encounter: Payer: Self-pay | Admitting: Family Medicine

## 2018-05-03 VITALS — BP 135/74 | HR 90 | Temp 97.9°F | Resp 16 | Ht 67.0 in | Wt 217.0 lb

## 2018-05-03 DIAGNOSIS — N3000 Acute cystitis without hematuria: Secondary | ICD-10-CM

## 2018-05-03 DIAGNOSIS — R350 Frequency of micturition: Secondary | ICD-10-CM | POA: Diagnosis not present

## 2018-05-03 DIAGNOSIS — R8290 Unspecified abnormal findings in urine: Secondary | ICD-10-CM | POA: Diagnosis not present

## 2018-05-03 LAB — POCT URINALYSIS DIPSTICK
Bilirubin, UA: NEGATIVE
Glucose, UA: NEGATIVE
Ketones, UA: NEGATIVE
Nitrite, UA: POSITIVE
Protein, UA: POSITIVE — AB
Spec Grav, UA: 1.025 (ref 1.010–1.025)
Urobilinogen, UA: 0.2 E.U./dL
pH, UA: 6 (ref 5.0–8.0)

## 2018-05-03 LAB — POCT URINE PREGNANCY: Preg Test, Ur: NEGATIVE

## 2018-05-03 MED ORDER — PHENAZOPYRIDINE HCL 200 MG PO TABS: 200.0000 mg | ORAL_TABLET | Freq: Three times a day (TID) | ORAL | 0 refills | Status: DC | PRN

## 2018-05-03 MED ORDER — SULFAMETHOXAZOLE-TRIMETHOPRIM 800-160 MG PO TABS: 1.0000 | ORAL_TABLET | Freq: Two times a day (BID) | ORAL | 0 refills | Status: DC

## 2018-05-03 NOTE — Patient Instructions (Signed)

## 2018-05-03 NOTE — Progress Notes (Signed)
ENM:MHWKGSU, Victoria Maples, FNP Chief Complaint  Patient presents with  . Urinary Frequency  . Urine Output    strong odor x 5 days   . Possible Pregnancy    request pregnancy test   HPI:  Presents with 5 days of dysuria, urinary urgency and urinary frequency Associated symptoms include:  urinary frequency and urinary urgency Patient states that she used spermicide and latex free condoms. Since then, urine has been malodorous and cloudy. Denies abdominal pain or cramping. Patient would like pregnancy test today. Not currently taking OCPs that were prescribed by the GYN. Would like to start this month.  There is no history of of similar symptoms. Sexually active:  Yes with female.   No concern for STI.  Prior to Admission medications   Medication Sig Start Date End Date Taking? Authorizing Provider  amLODipine (NORVASC) 10 MG tablet Take 1 tablet (10 mg total) by mouth daily. 03/04/18  Yes Dorena Dew, FNP  Biotin 1000 MCG tablet Take 1,000 mcg by mouth 3 (three) times daily.   Yes [provider]  busPIRone (BUSPAR) 10 MG tablet Take 1 tablet (10 mg total) by mouth 3 (three) times daily. 03/04/18  Yes Dorena Dew, FNP  butalbital-aspirin-caffeine [facility]) [phone] MG capsule Take 1 capsule by mouth 2 (two) times daily as needed for headache. 07/23/17  Yes Dorena Dew, FNP  DULoxetine (CYMBALTA) 30 MG capsule Take 1 capsule (30 mg total) by mouth 2 (two) times daily. 03/04/18  Yes Dorena Dew, FNP  ferrous fumarate (HEMOCYTE - 106 MG FE) 325 (106 Fe) MG TABS tablet Take 1 tablet (106 mg of iron total) by mouth daily. 08/11/16  Yes Micheline Chapman, NP  hydrOXYzine (ATARAX/VISTARIL) 25 MG tablet Take 1 tablet (25 mg total) by mouth 3 (three) times daily as needed. 03/04/18  Yes Dorena Dew, FNP  ibuprofen (ADVIL,MOTRIN) 600 MG tablet Take 1 tablet (600 mg total) by mouth every 8 (eight) hours as needed. 08/03/17  Yes Woodroe Mode, MD  losartan (COZAAR) 100 MG  tablet Take 1 tablet (100 mg total) by mouth daily. 03/04/18  Yes Dorena Dew, FNP  Norethindrone Acetate-Ethinyl Estradiol (LOESTRIN 1.5/30, 21,) 1.5-30 MG-MCG tablet Take 1 tablet by mouth daily. 03/11/18  Yes Lavonia Drafts, MD  ondansetron (ZOFRAN-ODT) 8 MG disintegrating tablet Take 1 tablet (8 mg total) by mouth every 8 (eight) hours as needed for nausea. 12/25/16  Yes Dorena Dew, FNP    Review of Systems:.  PE:  BP 135/74 (BP Location: Left Arm, Patient Position: Sitting, Cuff Size: Normal)   Pulse 90   Temp 97.9 F (36.6 C) (Oral)   Resp 16   Ht 5\' 7"  (1.702 m)   Wt 217 lb (98.4 kg)   LMP 03/27/2018   SpO2 100%   BMI 33.99 kg/m  Physical Exam  Constitutional: She is well-developed, well-nourished, and in no distress.  HENT:  Head: Normocephalic and atraumatic.  Cardiovascular: Normal rate and regular rhythm.  Pulmonary/Chest: Effort normal and breath sounds normal. No respiratory distress.  Abdominal: Soft. Bowel sounds are normal. There is no tenderness.  Psychiatric: Mood, memory, affect and judgment normal.    Results for orders placed or performed in visit on 05/03/18  Urinalysis Dipstick  Result Value Ref Range   Color, UA     Clarity, UA     Glucose, UA Negative Negative   Bilirubin, UA neg    Ketones, UA neg    Spec Grav, UA 1.025 1.010 -  1.025   Blood, UA moderate    pH, UA 6.0 5.0 - 8.0   Protein, UA Positive (A) Negative   Urobilinogen, UA 0.2 0.2 or 1.0 E.U./dL   Nitrite, UA positive    Leukocytes, UA Trace (A) Negative   Appearance     Odor    POCT urine pregnancy  Result Value Ref Range   Preg Test, Ur Negative Negative    Assessment and Plan:  1. Frequent urination - Urinalysis Dipstick - POCT urine pregnancy  2. Abnormal urinalysis  - Urine Culture - sulfamethoxazole-trimethoprim (BACTRIM DS,SEPTRA DS) 800-160 MG tablet; Take 1 tablet by mouth 2 (two) times daily.  Dispense: 6 tablet; Refill: 0 - phenazopyridine  (PYRIDIUM) 200 MG tablet; Take 1 tablet (200 mg total) by mouth 3 (three) times daily as needed for pain.  Dispense: 10 tablet; Refill: 0  3. Acute cystitis without hematuria  - sulfamethoxazole-trimethoprim (BACTRIM DS,SEPTRA DS) 800-160 MG tablet; Take 1 tablet by mouth 2 (two) times daily.  Dispense: 6 tablet; Refill: 0 - phenazopyridine (PYRIDIUM) 200 MG tablet; Take 1 tablet (200 mg total) by mouth 3 (three) times daily as needed for pain.  Dispense: 10 tablet; Refill: 0

## 2018-05-05 LAB — URINE CULTURE

## 2018-05-13 MED FILL — DULoxetine HCL 30 MG CPEP: 30 | 30 days supply | Qty: 60 | Fill #2

## 2018-06-05 ENCOUNTER — Ambulatory Visit: Payer: BLUE CROSS/BLUE SHIELD | Admitting: Family Medicine

## 2018-06-21 ENCOUNTER — Ambulatory Visit (INDEPENDENT_AMBULATORY_CARE_PROVIDER_SITE_OTHER): Payer: BLUE CROSS/BLUE SHIELD | Admitting: Family Medicine

## 2018-06-21 ENCOUNTER — Encounter: Payer: Self-pay | Admitting: Family Medicine

## 2018-06-21 VITALS — BP 120/84 | HR 79 | Temp 98.5°F | Ht 67.0 in | Wt 212.0 lb

## 2018-06-21 DIAGNOSIS — I10 Essential (primary) hypertension: Secondary | ICD-10-CM

## 2018-06-21 DIAGNOSIS — R300 Dysuria: Secondary | ICD-10-CM

## 2018-06-21 DIAGNOSIS — R8290 Unspecified abnormal findings in urine: Secondary | ICD-10-CM | POA: Diagnosis not present

## 2018-06-21 MED ORDER — NITROFURANTOIN MONOHYD MACRO 100 MG PO CAPS: 100.0000 mg | ORAL_CAPSULE | Freq: Two times a day (BID) | ORAL | 0 refills | Status: AC

## 2018-06-21 NOTE — Patient Instructions (Signed)
Nitrofurantoin tablets or capsules What is this medicine? NITROFURANTOIN (nye troe fyoor AN toyn) is an antibiotic. It is used to treat urinary tract infections. This medicine may be used for other purposes; ask your health care provider or pharmacist if you have questions. COMMON BRAND NAME(S): Macrobid, Macrodantin, Urotoin What should I tell my health care provider before I take this medicine? They need to know if you have any of these conditions: -anemia -diabetes -glucose-6-phosphate dehydrogenase deficiency -kidney disease -liver disease -lung disease -other chronic illness -an unusual or allergic reaction to nitrofurantoin, other antibiotics, other medicines, foods, dyes or preservatives -pregnant or trying to get pregnant -breast-feeding How should I use this medicine? Take this medicine by mouth with a glass of water. Follow the directions on the prescription label. Take this medicine with food or milk. Take your doses at regular intervals. Do not take your medicine more often than directed. Do not stop taking except on your doctor's advice. Talk to your pediatrician regarding the use of this medicine in children. While this drug may be prescribed for selected conditions, precautions do apply. Overdosage: If you think you have taken too much of this medicine contact a poison control center or emergency room at once. NOTE: This medicine is only for you. Do not share this medicine with others. What if I miss a dose? If you miss a dose, take it as soon as you can. If it is almost time for your next dose, take only that dose. Do not take double or extra doses. What may interact with this medicine? -antacids containing magnesium trisilicate -probenecid -quinolone antibiotics like ciprofloxacin, lomefloxacin, norfloxacin and ofloxacin -sulfinpyrazone This list may not describe all possible interactions. Give your health care provider a list of all the medicines, herbs,  non-prescription drugs, or dietary supplements you use. Also tell them if you smoke, drink alcohol, or use illegal drugs. Some items may interact with your medicine. What should I watch for while using this medicine? Tell your doctor or health care professional if your symptoms do not improve or if you get new symptoms. Drink several glasses of water a day. If you are taking this medicine for a long time, visit your doctor for regular checks on your progress. If you are diabetic, you may get a false positive result for sugar in your urine with certain brands of urine tests. Check with your doctor. What side effects may I notice from receiving this medicine? Side effects that you should report to your doctor or health care professional as soon as possible: -allergic reactions like skin rash or hives, swelling of the face, lips, or tongue -chest pain -cough -difficulty breathing -dizziness, drowsiness -fever or infection -joint aches or pains -pale or blue-tinted skin -redness, blistering, peeling or loosening of the skin, including inside the mouth -tingling, burning, pain, or numbness in hands or feet -unusual bleeding or bruising -unusually weak or tired -yellowing of eyes or skin Side effects that usually do not require medical attention (report to your doctor or health care professional if they continue or are bothersome): -dark urine -diarrhea -headache -loss of appetite -nausea or vomiting -temporary hair loss This list may not describe all possible side effects. Call your doctor for medical advice about side effects. You may report side effects to FDA at 1-800-FDA-1088. Where should I keep my medicine? Keep out of the reach of children. Store at room temperature between 15 and 30 degrees C (59 and 86 degrees F). Protect from light. Throw away any unused  medicine after the expiration date. NOTE: This sheet is a summary. It may not cover all possible information. If you have  questions about this medicine, talk to your doctor, pharmacist, or health care provider.  2018 Elsevier/Gold Standard (2008-04-29 15:56:47)  

## 2018-06-21 NOTE — Progress Notes (Signed)
  [name]   Progress Note: General Provider: Lanae Boast, FNP  SUBJECTIVE:  Urinary Tract Infection   This is a recurrent problem. The current episode started in the past 7 days. The problem occurs every urination. The problem has been unchanged. The quality of the pain is described as burning. The patient is experiencing no pain. There has been no fever. She is sexually active. There is no history of pyelonephritis. Pertinent negatives include no chills, discharge, frequency or nausea. She has tried increased fluids for the symptoms. The treatment provided no relief. Her past medical history is significant for recurrent UTIs.    Victoria Curry is a 52 y.o. female who  has a past medical history of Depression, Fibromyalgia, GERD (gastroesophageal reflux disease), and Hypertension.. Patient presents today for Urine Output (cloudy urine/ strong smell )  . Review of Systems  Constitutional: Negative for chills.  Gastrointestinal: Negative for nausea.  Genitourinary: Positive for dysuria. Negative for frequency.  All other systems reviewed and are negative.    OBJECTIVE: BP 120/84 (BP Location: Left Arm, Patient Position: Sitting, Cuff Size: Normal)   Pulse 79   Temp 98.5 F (36.9 C) (Oral)   Ht 5\' 7"  (1.702 m)   Wt 212 lb (96.2 kg)   BMI 33.20 kg/m   Physical Exam  Constitutional: She is oriented to person, place, and time. She appears well-developed and well-nourished. No distress.  HENT:  Head: Normocephalic and atraumatic.  Eyes: Pupils are equal, round, and reactive to light. Conjunctivae and EOM are normal.  Neck: Normal range of motion.  Cardiovascular: Normal rate, regular rhythm, normal heart sounds and intact distal pulses.  Pulmonary/Chest: Effort normal and breath sounds normal. No respiratory distress.  Abdominal: Soft. Bowel sounds are normal. She exhibits no distension.  Musculoskeletal: Normal range of motion.    Neurological: She is alert and oriented to person, place, and time.  Skin: Skin is warm and dry.  Psychiatric: She has a normal mood and affect. Her behavior is normal. Judgment and thought content normal.  Nursing note and vitals reviewed.   ASSESSMENT/PLAN:  1. Essential hypertension The current medical regimen is effective;  continue present plan and medications. The patient is asked to make an attempt to improve diet and exercise patterns to aid in medical management of this problem.   2. Dysuria  - Urinalysis Dipstick - Urine Culture  3. Abnormal urinalysis - nitrofurantoin, macrocrystal-monohydrate, (MACROBID) 100 MG capsule; Take 1 capsule (100 mg total) by mouth 2 (two) times daily for 7 days.  Dispense: 14 capsule; Refill: 0        The patient was given clear instructions to go to ER or return to medical center if symptoms do not improve, worsen or new problems develop. The patient verbalized understanding and agreed with plan of care.   Ms. [name]. [name], FNP-BC [facility] [address] [place], [place] [address] [phone]     This note has been created with Dragon speech recognition software and smart phrase technology. Any transcriptional errors are unintentional.

## 2018-06-23 LAB — URINE CULTURE

## 2018-06-25 MED FILL — DULoxetine HCL 30 MG CPEP: 30 | 30 days supply | Qty: 60 | Fill #3

## 2018-06-30 ENCOUNTER — Other Ambulatory Visit: Payer: Self-pay | Admitting: Family Medicine

## 2018-06-30 DIAGNOSIS — I10 Essential (primary) hypertension: Secondary | ICD-10-CM

## 2018-09-09 ENCOUNTER — Other Ambulatory Visit: Payer: Self-pay | Admitting: Family Medicine

## 2018-09-09 DIAGNOSIS — F329 Major depressive disorder, single episode, unspecified: Secondary | ICD-10-CM

## 2018-10-18 ENCOUNTER — Other Ambulatory Visit: Payer: Self-pay

## 2018-10-18 DIAGNOSIS — I10 Essential (primary) hypertension: Secondary | ICD-10-CM

## 2018-10-18 MED ORDER — LOSARTAN POTASSIUM 100 MG PO TABS: 100.0000 mg | ORAL_TABLET | Freq: Every day | ORAL | 5 refills | Status: DC

## 2018-12-21 ENCOUNTER — Other Ambulatory Visit: Payer: Self-pay | Admitting: Family Medicine

## 2018-12-21 DIAGNOSIS — F329 Major depressive disorder, single episode, unspecified: Secondary | ICD-10-CM

## 2019-01-22 ENCOUNTER — Ambulatory Visit (INDEPENDENT_AMBULATORY_CARE_PROVIDER_SITE_OTHER): Payer: BLUE CROSS/BLUE SHIELD | Admitting: Family Medicine

## 2019-01-22 ENCOUNTER — Encounter: Payer: Self-pay | Admitting: Family Medicine

## 2019-01-22 ENCOUNTER — Other Ambulatory Visit: Payer: Self-pay

## 2019-01-22 DIAGNOSIS — I10 Essential (primary) hypertension: Secondary | ICD-10-CM

## 2019-01-22 DIAGNOSIS — Z309 Encounter for contraceptive management, unspecified: Secondary | ICD-10-CM

## 2019-01-22 DIAGNOSIS — L509 Urticaria, unspecified: Secondary | ICD-10-CM

## 2019-01-22 DIAGNOSIS — F419 Anxiety disorder, unspecified: Secondary | ICD-10-CM

## 2019-01-22 DIAGNOSIS — F329 Major depressive disorder, single episode, unspecified: Secondary | ICD-10-CM

## 2019-01-22 MED ORDER — BUSPIRONE HCL 15 MG PO TABS: 15.0000 mg | ORAL_TABLET | Freq: Two times a day (BID) | ORAL | 2 refills | Status: DC

## 2019-01-22 MED ORDER — DULOXETINE HCL 30 MG PO CPEP: 90.0000 mg | ORAL_CAPSULE | Freq: Every day | ORAL | 2 refills | Status: DC

## 2019-01-22 MED ORDER — NORETHIN ACE-ETH ESTRAD-FE 1.5-30 MG-MCG PO TABS: 1.0000 | ORAL_TABLET | Freq: Every day | ORAL | 3 refills | Status: DC

## 2019-01-22 MED ORDER — AMLODIPINE BESYLATE 10 MG PO TABS: 10.0000 mg | ORAL_TABLET | Freq: Every day | ORAL | 5 refills | Status: DC

## 2019-01-22 MED ORDER — HYDROXYZINE HCL 10 MG PO TABS: 10.0000 mg | ORAL_TABLET | Freq: Two times a day (BID) | ORAL | 2 refills | Status: AC | PRN

## 2019-01-22 MED ORDER — LOSARTAN POTASSIUM 100 MG PO TABS: 100.0000 mg | ORAL_TABLET | Freq: Every day | ORAL | 5 refills | Status: DC

## 2019-01-22 NOTE — Progress Notes (Signed)
  Patient [facility]  Virtual Visit via Telephone Note  I connected with Victoria Curry on 01/22/19 at  1:20 PM EDT by telephone and verified that I am speaking with the correct person using two identifiers.   I discussed the limitations, risks, security and privacy concerns of performing an evaluation and management service by telephone and the availability of in person appointments. I also discussed with the patient that there may be a patient responsible charge related to this service. The patient expressed understanding and agreed to proceed.   History of Present Illness: Patient reports a history of depression and anxiety that was managed with Cymbalta and buspar.  She is currently undergoing a divorce.  She relocated to [facility] in July 2019.  She states that she is not currently unemployed.  She is back in [place] for the next few months caring for 5 children. She states that all of these events are increasing her depression and anxiety. She states that she takes her Cymbalta every day as prescribed.  She states that she takes 2 of her BuSpar in the morning and if needed at night she will take a half of 1.  She denies psychosis, delusional thinking, homicidal, or suicidal ideation intent or plan.  She does endorse thoughts of death, but no suicidal ideations.   Observations/Objective: Patient with regular voice tone, rate and rhythm. Speaking calmly and is in no apparent distress.     Assessment and Plan: 1. Reactive depression Increased cymbalta to 90mg  po daily.  - DULoxetine (CYMBALTA) 30 MG capsule; Take 3 capsules (90 mg total) by mouth daily.  Dispense: 90 capsule; Refill: 2  2. Anxiety Increased buspar to 15mg  BID. Discussed the mechanism of action and the need to take medications consistently.  - busPIRone (BUSPAR) 15 MG tablet; Take 1 tablet (15 mg total) by mouth 2 (two) times daily.  Dispense: 60 tablet; Refill: 2 - hydrOXYzine  (ATARAX/VISTARIL) 10 MG tablet; Take 1 tablet (10 mg total) by mouth 2 (two) times daily as needed for up to 30 days.  Dispense: 60 tablet; Refill: 2   4. Essential hypertension - losartan (COZAAR) 100 MG tablet; Take 1 tablet (100 mg total) by mouth daily.  Dispense: 30 tablet; Refill: 5 - amLODipine (NORVASC) 10 MG tablet; Take 1 tablet (10 mg total) by mouth daily.  Dispense: 30 tablet; Refill: 5  5. Encounter for contraceptive management, unspecified type Refilled medications - norethindrone-ethinyl estradiol-iron (MICROGESTIN FE 1.5/30) 1.5-30 MG-MCG tablet; Take 1 tablet by mouth daily.  Dispense: 3 Package; Refill: 3     Follow Up Instructions:    I discussed the assessment and treatment plan with the patient. The patient was provided an opportunity to ask questions and all were answered. The patient agreed with the plan and demonstrated an understanding of the instructions.   The patient was advised to call back or seek an in-person evaluation if the symptoms worsen or if the condition fails to improve as anticipated.  I provided 25 minutes of non-face-to-face time during this encounter.  Ms. Andr L. [name], FNP-BC [facility] [address] [place], [place] [address] [phone]

## 2019-05-30 ENCOUNTER — Ambulatory Visit: Payer: BLUE CROSS/BLUE SHIELD | Admitting: Family Medicine

## 2019-07-02 ENCOUNTER — Other Ambulatory Visit: Payer: Self-pay

## 2019-07-02 ENCOUNTER — Encounter (HOSPITAL_COMMUNITY): Payer: Self-pay | Admitting: *Deleted

## 2019-07-02 ENCOUNTER — Encounter: Payer: Self-pay | Admitting: Family Medicine

## 2019-07-02 ENCOUNTER — Ambulatory Visit (INDEPENDENT_AMBULATORY_CARE_PROVIDER_SITE_OTHER): Payer: Self-pay | Admitting: Family Medicine

## 2019-07-02 ENCOUNTER — Encounter (HOSPITAL_COMMUNITY): Payer: Self-pay

## 2019-07-02 VITALS — BP 136/86 | HR 90 | Temp 98.4°F | Resp 16 | Ht 67.0 in | Wt 211.0 lb

## 2019-07-02 DIAGNOSIS — F419 Anxiety disorder, unspecified: Secondary | ICD-10-CM

## 2019-07-02 DIAGNOSIS — Z309 Encounter for contraceptive management, unspecified: Secondary | ICD-10-CM

## 2019-07-02 DIAGNOSIS — Z23 Encounter for immunization: Secondary | ICD-10-CM

## 2019-07-02 DIAGNOSIS — I10 Essential (primary) hypertension: Secondary | ICD-10-CM

## 2019-07-02 DIAGNOSIS — F329 Major depressive disorder, single episode, unspecified: Secondary | ICD-10-CM

## 2019-07-02 DIAGNOSIS — R8290 Unspecified abnormal findings in urine: Secondary | ICD-10-CM

## 2019-07-02 LAB — POCT URINALYSIS DIPSTICK
Bilirubin, UA: NEGATIVE
Glucose, UA: NEGATIVE
Ketones, UA: NEGATIVE
Nitrite, UA: POSITIVE
Protein, UA: POSITIVE — AB
Spec Grav, UA: 1.025 (ref 1.010–1.025)
Urobilinogen, UA: 0.2 E.U./dL
pH, UA: 6 (ref 5.0–8.0)

## 2019-07-02 MED ORDER — FERROUS FUMARATE 325 (106 FE) MG PO TABS: 1.0000 | ORAL_TABLET | Freq: Every day | ORAL | 2 refills | Status: DC

## 2019-07-02 MED ORDER — LOSARTAN POTASSIUM 100 MG PO TABS: 100.0000 mg | ORAL_TABLET | Freq: Every day | ORAL | 5 refills | Status: DC

## 2019-07-02 MED ORDER — AMLODIPINE BESYLATE 10 MG PO TABS: 10.0000 mg | ORAL_TABLET | Freq: Every day | ORAL | 5 refills | Status: DC

## 2019-07-02 MED ORDER — DULOXETINE HCL 60 MG PO CPEP: 60.0000 mg | ORAL_CAPSULE | Freq: Every day | ORAL | 2 refills | Status: DC

## 2019-07-02 MED ORDER — ONDANSETRON 8 MG PO TBDP: 8.0000 mg | ORAL_TABLET | Freq: Three times a day (TID) | ORAL | 0 refills | Status: DC | PRN

## 2019-07-02 MED ORDER — NORETHIN ACE-ETH ESTRAD-FE 1.5-30 MG-MCG PO TABS: 1.0000 | ORAL_TABLET | Freq: Every day | ORAL | 3 refills | Status: DC

## 2019-07-02 MED ORDER — DULOXETINE HCL 30 MG PO CPEP: 90.0000 mg | ORAL_CAPSULE | Freq: Every day | ORAL | 2 refills | Status: DC

## 2019-07-02 MED ORDER — BUSPIRONE HCL 15 MG PO TABS: 15.0000 mg | ORAL_TABLET | Freq: Two times a day (BID) | ORAL | 2 refills | Status: DC

## 2019-07-02 NOTE — Progress Notes (Signed)
Patient [facility]   Progress Note: General Provider: Lanae Boast, FNP  SUBJECTIVE:   Victoria Curry is a 53 y.o. female who  has a past medical history of Depression, Fibromyalgia, GERD (gastroesophageal reflux disease), and Hypertension.. Patient presents today for Hypertension Patient states that she has been without her Cymbalta for one week. She reports SNRI withdrawal symptoms that include nausea, body aches and dizziness. She states that she was very stressed due to a recent breakup. Had thoughts of death and dying a week ago. Today, she denies psychosis, delusional thinking, suicidal/homicidal ideation, intent or plan.  She also needs refills on medications. She is currently living in [place] and is traveling back and forth to [place] to visit her children and family.    Review of Systems  Constitutional: Negative.   HENT: Negative.   Eyes: Negative.   Respiratory: Negative.   Cardiovascular: Negative.   Gastrointestinal: Negative.   Genitourinary: Negative.   Musculoskeletal: Negative.   Skin: Negative.   Neurological: Negative.   Psychiatric/Behavioral: Negative.      OBJECTIVE: BP 136/86 (BP Location: Left Arm, Patient Position: Sitting, Cuff Size: Large)   Pulse 90   Temp 98.4 F (36.9 C) (Oral)   Resp 16   Ht 5\' 7"  (1.702 m)   Wt 211 lb (95.7 kg)   SpO2 100%   BMI 33.05 kg/m   Wt Readings from Last 3 Encounters:  07/02/19 211 lb (95.7 kg)  06/21/18 212 lb (96.2 kg)  05/03/18 217 lb (98.4 kg)     Physical Exam Vitals signs and nursing note reviewed.  Constitutional:      General: She is not in acute distress.    Appearance: Normal appearance.  HENT:     Head: Normocephalic and atraumatic.  Eyes:     Extraocular Movements: Extraocular movements intact.     Conjunctiva/sclera: Conjunctivae normal.     Pupils: Pupils are equal, round, and reactive to light.  Cardiovascular:     Rate and Rhythm: Normal  rate and regular rhythm.     Heart sounds: No murmur.  Pulmonary:     Effort: Pulmonary effort is normal.     Breath sounds: Normal breath sounds.  Musculoskeletal: Normal range of motion.  Skin:    General: Skin is warm and dry.  Neurological:     Mental Status: She is alert and oriented to person, place, and time.  Psychiatric:        Mood and Affect: Mood normal.        Behavior: Behavior normal.        Thought Content: Thought content normal.        Judgment: Judgment normal.     ASSESSMENT/PLAN:   1. Essential hypertension - Urinalysis Dipstick - amLODipine (NORVASC) 10 MG tablet; Take 1 tablet (10 mg total) by mouth daily.  Dispense: 30 tablet; Refill: 5 - losartan (COZAAR) 100 MG tablet; Take 1 tablet (100 mg total) by mouth daily.  Dispense: 30 tablet; Refill: 5  2. Anxiety - busPIRone (BUSPAR) 15 MG tablet; Take 1 tablet (15 mg total) by mouth 2 (two) times daily.  Dispense: 60 tablet; Refill: 2 Patient also reports taking CBD oil to help with this.   3. Reactive depression - DULoxetine (CYMBALTA) 30 MG capsule; Take 3 capsules (90 mg total) by mouth daily.  Dispense: 90 capsule; Refill: 2  4. Encounter for contraceptive management, unspecified type - norethindrone-ethinyl estradiol-iron (MICROGESTIN FE 1.5/30) 1.5-30 MG-MCG tablet; Take 1  tablet by mouth daily.  Dispense: 3 Package; Refill: 3  5. Abnormal urine No complaints today. Will culture and treat.  - Urine Culture     Return in about 3 months (around 10/01/2019) for htn.    The patient was given clear instructions to go to ER or return to medical center if symptoms do not improve, worsen or new problems develop. The patient verbalized understanding and agreed with plan of care.   Ms. [name]. [name], FNP-BC [facility] [address] [place], [place] [address] [phone]

## 2019-07-02 NOTE — Patient Instructions (Signed)

## 2019-07-04 ENCOUNTER — Telehealth: Payer: Self-pay

## 2019-07-04 ENCOUNTER — Other Ambulatory Visit: Payer: Self-pay | Admitting: Family Medicine

## 2019-07-04 DIAGNOSIS — N3000 Acute cystitis without hematuria: Secondary | ICD-10-CM

## 2019-07-04 LAB — URINE CULTURE

## 2019-07-04 MED ORDER — CIPROFLOXACIN HCL 500 MG PO TABS: 500.0000 mg | ORAL_TABLET | Freq: Two times a day (BID) | ORAL | 0 refills | Status: AC

## 2019-07-04 MED FILL — CIPROFLOXACIN HCL 500 MG TA: 500 | 5 days supply | Qty: 10 | Fill #0

## 2019-07-04 NOTE — Telephone Encounter (Signed)
Called, no answer. Left a message to call back. Thanks!  

## 2019-07-04 NOTE — Telephone Encounter (Signed)
-----   Message from Lanae Boast, [place] sent at 07/04/2019  9:36 AM EDT ----- Please let patient know that she does have a urinary tract infection. I am sending an antibiotic to the pharmacy for this.

## 2019-07-04 NOTE — Telephone Encounter (Signed)
Patient called back. I advised of urine showing urinary tract infections and to take cipro 500mg  as directed for 5 days, patient verbalized understanding. Thanks!

## 2019-07-04 NOTE — Progress Notes (Signed)
Please let patient know that she does have a urinary tract infection. I am sending an antibiotic to the pharmacy for this.

## 2019-10-01 ENCOUNTER — Ambulatory Visit: Payer: Self-pay | Admitting: Family Medicine

## 2019-10-30 ENCOUNTER — Other Ambulatory Visit: Payer: Self-pay | Admitting: Family Medicine

## 2019-10-30 DIAGNOSIS — F419 Anxiety disorder, unspecified: Secondary | ICD-10-CM

## 2019-11-25 ENCOUNTER — Other Ambulatory Visit: Payer: Self-pay | Admitting: Internal Medicine

## 2019-11-25 ENCOUNTER — Other Ambulatory Visit: Payer: Self-pay | Admitting: Family Medicine

## 2019-11-25 DIAGNOSIS — F329 Major depressive disorder, single episode, unspecified: Secondary | ICD-10-CM

## 2019-11-25 DIAGNOSIS — F419 Anxiety disorder, unspecified: Secondary | ICD-10-CM

## 2019-11-27 NOTE — Telephone Encounter (Signed)
NP Edison Pace is now the provider. Thanks.

## 2019-12-08 ENCOUNTER — Other Ambulatory Visit: Payer: Self-pay

## 2019-12-08 DIAGNOSIS — F419 Anxiety disorder, unspecified: Secondary | ICD-10-CM

## 2019-12-08 MED ORDER — BUSPIRONE HCL 15 MG PO TABS: 15.0000 mg | ORAL_TABLET | Freq: Two times a day (BID) | ORAL | 0 refills | Status: DC

## 2019-12-26 ENCOUNTER — Other Ambulatory Visit: Payer: Self-pay | Admitting: Family Medicine

## 2019-12-26 DIAGNOSIS — F329 Major depressive disorder, single episode, unspecified: Secondary | ICD-10-CM

## 2020-01-01 ENCOUNTER — Telehealth: Payer: Self-pay | Admitting: Family Medicine

## 2020-01-01 NOTE — Telephone Encounter (Signed)
Pt is actively seeking help/searching for a psychologist

## 2020-01-14 ENCOUNTER — Other Ambulatory Visit: Payer: Self-pay | Admitting: Internal Medicine

## 2020-01-14 DIAGNOSIS — F419 Anxiety disorder, unspecified: Secondary | ICD-10-CM

## 2020-07-24 ENCOUNTER — Other Ambulatory Visit: Payer: Self-pay | Admitting: Family Medicine

## 2020-07-24 DIAGNOSIS — Z309 Encounter for contraceptive management, unspecified: Secondary | ICD-10-CM

## 2020-11-26 ENCOUNTER — Encounter: Payer: Self-pay | Admitting: Family Medicine

## 2020-12-21 DIAGNOSIS — E559 Vitamin D deficiency, unspecified: Secondary | ICD-10-CM

## 2020-12-21 HISTORY — DX: Vitamin D deficiency, unspecified: E55.9

## 2020-12-22 ENCOUNTER — Ambulatory Visit: Payer: Self-pay | Admitting: Nurse Practitioner

## 2020-12-24 ENCOUNTER — Ambulatory Visit (INDEPENDENT_AMBULATORY_CARE_PROVIDER_SITE_OTHER): Payer: Self-pay | Admitting: Family Medicine

## 2020-12-24 ENCOUNTER — Other Ambulatory Visit: Payer: Self-pay

## 2020-12-24 ENCOUNTER — Encounter: Payer: Self-pay | Admitting: Family Medicine

## 2020-12-24 VITALS — BP 129/91 | HR 89 | Temp 97.7°F | Ht 65.0 in | Wt 219.0 lb

## 2020-12-24 DIAGNOSIS — D649 Anemia, unspecified: Secondary | ICD-10-CM

## 2020-12-24 DIAGNOSIS — F329 Major depressive disorder, single episode, unspecified: Secondary | ICD-10-CM

## 2020-12-24 DIAGNOSIS — Z0000 Encounter for general adult medical examination without abnormal findings: Secondary | ICD-10-CM

## 2020-12-24 DIAGNOSIS — R110 Nausea: Secondary | ICD-10-CM

## 2020-12-24 DIAGNOSIS — I10 Essential (primary) hypertension: Secondary | ICD-10-CM

## 2020-12-24 DIAGNOSIS — Y93E6 Activity, residential relocation: Secondary | ICD-10-CM

## 2020-12-24 DIAGNOSIS — F419 Anxiety disorder, unspecified: Secondary | ICD-10-CM

## 2020-12-24 DIAGNOSIS — Z7689 Persons encountering health services in other specified circumstances: Secondary | ICD-10-CM

## 2020-12-24 DIAGNOSIS — Z09 Encounter for follow-up examination after completed treatment for conditions other than malignant neoplasm: Secondary | ICD-10-CM

## 2020-12-24 MED ORDER — BUSPIRONE HCL 15 MG PO TABS: 15.0000 mg | ORAL_TABLET | Freq: Two times a day (BID) | ORAL | 3 refills | Status: DC

## 2020-12-24 MED ORDER — LOSARTAN POTASSIUM 100 MG PO TABS: 100.0000 mg | ORAL_TABLET | Freq: Every day | ORAL | 3 refills | Status: DC

## 2020-12-24 MED ORDER — DULOXETINE HCL 30 MG PO CPEP
90.0000 mg | ORAL_CAPSULE | Freq: Every day | ORAL | 3 refills | Status: DC
Start: 2020-12-24 — End: 2022-01-09

## 2020-12-24 MED ORDER — HYDROXYZINE HCL 25 MG PO TABS: 25.0000 mg | ORAL_TABLET | Freq: Three times a day (TID) | ORAL | 6 refills | Status: DC | PRN

## 2020-12-24 MED ORDER — ONDANSETRON 8 MG PO TBDP
8.0000 mg | ORAL_TABLET | Freq: Three times a day (TID) | ORAL | 1 refills | Status: DC | PRN
Start: 2020-12-24 — End: 2022-01-09

## 2020-12-24 MED ORDER — AMLODIPINE BESYLATE 10 MG PO TABS: 10.0000 mg | ORAL_TABLET | Freq: Every day | ORAL | 3 refills | Status: DC

## 2020-12-24 NOTE — Progress Notes (Signed)
[facility]   Established Patient Office Visit  Subjective:  Patient ID: ECHO PROPP, female    DOB: Jan 14, 1966  Age: 55 y.o. MRN: [id number]  CC:  Chief Complaint  Patient presents with  . New Patient (Initial Visit)    Re -est care , needs a letter for emotion support , having trouble sleeping , delta 8 has help     HPI ELDONNA NEUENFELDT is a 55 year old female who presents to [facility] today.    Patient Active Problem List   Diagnosis Date Noted  . Fibroid uterus 08/03/2017  . Menorrhagia with regular cycle 08/03/2017  . Acute non intractable tension-type headache 12/25/2016  . Nausea 12/25/2016  . Masses of both breasts 12/29/2015  . Absolute anemia 11/13/2015  . Essential hypertension 10/09/2015  . Gastroesophageal reflux disease without esophagitis 10/09/2015  . Metabolic syndrome 93/26/7124  . Fibromyalgia 10/09/2015  . Depression 10/09/2015    Current Status: This will be Ms. Creveling's initial office visit with me. She was previously not seeing a Physician regularly for her PCP needs. Since her last office visit, she has c/o urine odor intermittently for 1 year now. She denies urinary frequency, discharge, dysuria, urinary itching, burning, hematuria, and suprapubic pain/discomfort. She denies visual changes, chest pain, cough, shortness of breath, heart palpitations, and falls. She has occasional headaches and dizziness with position changes. Denies severe headaches, confusion, seizures, double vision, and blurred vision, nausea and vomiting. Her anxiety/depression is stable today. She denies suicidal ideations, homicidal ideations, or auditory hallucinations. She denies fevers, chills, fatigue, recent infections, weight loss, and night sweats. No reports of GI problems such as diarrhea, and constipation. She has no reports of blood in stools, dysuria and hematuria. She is taking all medications as prescribed. She  denies pain today.    Past Medical History:  Diagnosis Date  . Depression   . Fibromyalgia   . GERD (gastroesophageal reflux disease)   . Hypertension     Past Surgical History:  Procedure Laterality Date  . BREAST LUMPECTOMY      Family History  Problem Relation Age of Onset  . Diabetes Mother   . Hypertension Mother   . Alzheimer's disease Mother   . Cancer Mother   . Heart disease Mother   . Hypertension Father   . Heart disease Father   . Cancer Father        testiculiar     Social History   Socioeconomic History  . Marital status: Divorced    Spouse name: Not on file  . Number of children: Not on file  . Years of education: Not on file  . Highest education level: Not on file  Occupational History  . Not on file  Tobacco Use  . Smoking status: Former Research scientist (life sciences)  . Smokeless tobacco: Never Used  Vaping Use  . Vaping Use: Never used  Substance and Sexual Activity  . Alcohol use: No  . Drug use: No  . Sexual activity: Yes  Other Topics Concern  . Not on file  Social History Narrative  . Not on file   Social Determinants of Health   Financial Resource Strain: Not on file  Food Insecurity: Not on file  Transportation Needs: Not on file  Physical Activity: Not on file  Stress: Not on file  Social Connections: Not on file  Intimate Partner Violence: Not on file    Outpatient Medications Prior to Visit  Medication Sig Dispense  Refill  . acetaminophen (TYLENOL) 650 MG CR tablet Take 650 mg by mouth every 8 (eight) hours as needed for pain.    Marland Kitchen ibuprofen (ADVIL,MOTRIN) 600 MG tablet Take 1 tablet (600 mg total) by mouth every 8 (eight) hours as needed. 30 tablet 2  . norethindrone-ethinyl estradiol-iron (MICROGESTIN FE 1.5/30) 1.5-30 MG-MCG tablet Take 1 tablet by mouth daily. 3 Package 3  . amLODipine (NORVASC) 10 MG tablet Take 1 tablet (10 mg total) by mouth daily. 30 tablet 5  . busPIRone (BUSPAR) 15 MG tablet Take 1 tablet (15 mg total) by mouth 2  (two) times daily. Must make appt for more refills. 60 tablet 0  . DULoxetine (CYMBALTA) 30 MG capsule TAKE 3 CAPSULES BY MOUTH ONCE DAILY 90 capsule 0  . losartan (COZAAR) 100 MG tablet Take 1 tablet (100 mg total) by mouth daily. 30 tablet 5  . ondansetron (ZOFRAN-ODT) 8 MG disintegrating tablet Take 1 tablet (8 mg total) by mouth every 8 (eight) hours as needed for nausea or vomiting. 20 tablet 0  . ferrous fumarate (HEMOCYTE - 106 MG FE) 325 (106 Fe) MG TABS tablet Take 1 tablet (106 mg of iron total) by mouth daily. (Patient not taking: Reported on 12/24/2020) 30 tablet 2  . Biotin 1000 MCG tablet Take 1,000 mcg by mouth 3 (three) times daily.     No facility-administered medications prior to visit.    Allergies  Allergen Reactions  . Pineapple Swelling  . Adhesive [Tape] Other (See Comments)    Skin irritation   . Codeine Itching  . Latex Other (See Comments)    Skin irritation   . Shellfish Allergy     ROS Review of Systems  Constitutional: Negative.   HENT: Negative.   Eyes: Negative.   Respiratory: Negative.   Cardiovascular: Negative.   Gastrointestinal: Positive for nausea (occasional).  Endocrine: Negative.   Genitourinary: Negative.   Musculoskeletal: Negative.   Skin: Negative.   Allergic/Immunologic: Negative.   Neurological: Positive for dizziness (occasional ) and headaches (occasional ).  Hematological: Negative.   Psychiatric/Behavioral: Negative.       Objective:    Physical Exam Vitals and nursing note reviewed.  Constitutional:      Appearance: Normal appearance.  HENT:     Head: Normocephalic and atraumatic.     Nose: Nose normal.     Mouth/Throat:     Mouth: Mucous membranes are moist.     Pharynx: Oropharynx is clear.  Cardiovascular:     Rate and Rhythm: Normal rate and regular rhythm.     Pulses: Normal pulses.     Heart sounds: Normal heart sounds.  Pulmonary:     Effort: Pulmonary effort is normal.     Breath sounds: Normal breath  sounds.  Abdominal:     General: Bowel sounds are normal.     Palpations: Abdomen is soft.  Musculoskeletal:        General: Normal range of motion.     Cervical back: Normal range of motion and neck supple.  Skin:    General: Skin is warm and dry.  Neurological:     General: No focal deficit present.     Mental Status: She is alert and oriented to person, place, and time.  Psychiatric:        Mood and Affect: Mood normal.        Behavior: Behavior normal.        Thought Content: Thought content normal.  Judgment: Judgment normal.     BP (!) 129/91 (Patient Position: Sitting, Cuff Size: Large)   Pulse 89   Temp 97.7 F (36.5 C) (Temporal)   Ht 5\' 5"  (1.651 m)   Wt 219 lb (99.3 kg)   SpO2 99%   BMI 36.44 kg/m  Wt Readings from Last 3 Encounters:  12/24/20 219 lb (99.3 kg)  07/02/19 211 lb (95.7 kg)  06/21/18 212 lb (96.2 kg)     Health Maintenance Due  Topic Date Due  . Hepatitis C Screening  Never done  . COVID-19 Vaccine (1) Never done  . COLONOSCOPY (Pts 45-74yrs Insurance coverage will need to be confirmed)  Never done  . MAMMOGRAM  01/20/2018  . PAP SMEAR-Modifier  09/21/2019    There are no preventive care reminders to display for this patient.  Lab Results  Component Value Date   TSH 1.488 10/08/2015   Lab Results  Component Value Date   WBC 7.1 07/13/2017   HGB 12.0 07/13/2017   HCT 36.3 07/13/2017   MCV 93.8 07/13/2017   PLT 379 07/13/2017   Lab Results  Component Value Date   NA 139 03/04/2018   K 3.8 03/04/2018   CO2 17 (L) 03/04/2018   GLUCOSE 96 03/04/2018   BUN 15 03/04/2018   CREATININE 0.94 03/04/2018   BILITOT 0.3 03/05/2017   ALKPHOS 69 03/05/2017   AST 16 03/05/2017   ALT 12 03/05/2017   PROT 7.4 03/05/2017   ALBUMIN 4.2 03/05/2017   CALCIUM 9.0 03/04/2018   Lab Results  Component Value Date   CHOL 152 08/11/2016   Lab Results  Component Value Date   HDL 45 (L) 08/11/2016   Lab Results  Component Value Date    LDLCALC 83 08/11/2016   Lab Results  Component Value Date   TRIG 119 08/11/2016   Lab Results  Component Value Date   CHOLHDL 3.4 08/11/2016   Lab Results  Component Value Date   HGBA1C 4.9 08/11/2016   .h Assessment & Plan:   1. Encounter to establish care  2. Residential relocation  3. Essential hypertension The current medical regimen is effective; blood pressure is stable today; continue present plan and medications as prescribed. She will continue to take medications as prescribed, to decrease high sodium intake, excessive alcohol intake, increase potassium intake, smoking cessation, and increase physical activity of at least 30 minutes of cardio activity daily. She will continue to follow Heart Healthy or DASH diet. - amLODipine (NORVASC) 10 MG tablet; Take 1 tablet (10 mg total) by mouth daily.  Dispense: 90 tablet; Refill: 3 - losartan (COZAAR) 100 MG tablet; Take 1 tablet (100 mg total) by mouth daily.  Dispense: 90 tablet; Refill: 3  4. Anxiety - busPIRone (BUSPAR) 15 MG tablet; Take 1 tablet (15 mg total) by mouth 2 (two) times daily. Must make appt for more refills.  Dispense: 180 tablet; Refill: 3  5. Reactive depression - DULoxetine (CYMBALTA) 30 MG capsule; Take 3 capsules (90 mg total) by mouth daily.  Dispense: 90 capsule; Refill: 3  6. Nausea - ondansetron (ZOFRAN-ODT) 8 MG disintegrating tablet; Take 1 tablet (8 mg total) by mouth every 8 (eight) hours as needed for nausea or vomiting.  Dispense: 20 tablet; Refill: 1  7. Anemia, unspecified type - Iron and TIBC([facility])  8. Healthcare maintenance - Urinalysis - CBC with Differential - Comprehensive metabolic panel - Hemoglobin A1c - TSH - Lipid Panel - Vitamin B12 - Vitamin D, 25-hydroxy  9. Follow up  She will follow up in 1 month.   Meds ordered this encounter  Medications  . hydrOXYzine (ATARAX/VISTARIL) 25 MG tablet    Sig: Take 1 tablet (25 mg total) by mouth 3 (three) times daily  as needed.    Dispense:  90 tablet    Refill:  6  . amLODipine (NORVASC) 10 MG tablet    Sig: Take 1 tablet (10 mg total) by mouth daily.    Dispense:  90 tablet    Refill:  3  . busPIRone (BUSPAR) 15 MG tablet    Sig: Take 1 tablet (15 mg total) by mouth 2 (two) times daily. Must make appt for more refills.    Dispense:  180 tablet    Refill:  3  . DULoxetine (CYMBALTA) 30 MG capsule    Sig: Take 3 capsules (90 mg total) by mouth daily.    Dispense:  90 capsule    Refill:  3  . losartan (COZAAR) 100 MG tablet    Sig: Take 1 tablet (100 mg total) by mouth daily.    Dispense:  90 tablet    Refill:  3  . ondansetron (ZOFRAN-ODT) 8 MG disintegrating tablet    Sig: Take 1 tablet (8 mg total) by mouth every 8 (eight) hours as needed for nausea or vomiting.    Dispense:  20 tablet    Refill:  1    Orders Placed This Encounter  Procedures  . Urinalysis  . CBC with Differential  . Comprehensive metabolic panel  . Hemoglobin A1c  . TSH  . Lipid Panel  . Vitamin B12  . Vitamin D, 25-hydroxy  . Iron and TIBC([facility])    Referral Orders  No referral(s) requested today    Kathe Becton, MSN, ANE, FNP-BC [facility] [place], [place] [address] [phone] fax   Problem List Items Addressed This Visit      Cardiovascular and Mediastinum   Essential hypertension   Relevant Medications   amLODipine (NORVASC) 10 MG tablet   losartan (COZAAR) 100 MG tablet     Other   Absolute anemia   Relevant Orders   Iron and TIBC([facility])   Depression   Relevant Medications   hydrOXYzine (ATARAX/VISTARIL) 25 MG tablet   busPIRone (BUSPAR) 15 MG tablet   DULoxetine (CYMBALTA) 30 MG capsule   Nausea   Relevant Medications   ondansetron (ZOFRAN-ODT) 8 MG disintegrating tablet    Other Visit Diagnoses    Encounter to establish care    -  Primary    Residential relocation       Anxiety       Relevant Medications   hydrOXYzine (ATARAX/VISTARIL) 25 MG tablet   busPIRone (BUSPAR) 15 MG tablet   DULoxetine (CYMBALTA) 30 MG capsule   Healthcare maintenance       Relevant Orders   Urinalysis   CBC with Differential   Comprehensive metabolic panel   Hemoglobin A1c   TSH   Lipid Panel   Vitamin B12   Vitamin D, 25-hydroxy   Follow up          Meds ordered this encounter  Medications  . hydrOXYzine (ATARAX/VISTARIL) 25 MG tablet    Sig: Take 1 tablet (25 mg total) by mouth 3 (three) times daily as needed.    Dispense:  90 tablet    Refill:  6  . amLODipine (NORVASC) 10 MG tablet    Sig: Take 1  tablet (10 mg total) by mouth daily.    Dispense:  90 tablet    Refill:  3  . busPIRone (BUSPAR) 15 MG tablet    Sig: Take 1 tablet (15 mg total) by mouth 2 (two) times daily. Must make appt for more refills.    Dispense:  180 tablet    Refill:  3  . DULoxetine (CYMBALTA) 30 MG capsule    Sig: Take 3 capsules (90 mg total) by mouth daily.    Dispense:  90 capsule    Refill:  3  . losartan (COZAAR) 100 MG tablet    Sig: Take 1 tablet (100 mg total) by mouth daily.    Dispense:  90 tablet    Refill:  3  . ondansetron (ZOFRAN-ODT) 8 MG disintegrating tablet    Sig: Take 1 tablet (8 mg total) by mouth every 8 (eight) hours as needed for nausea or vomiting.    Dispense:  20 tablet    Refill:  1    Follow-up: No follow-ups on file.    Azzie Glatter, FNP

## 2020-12-25 ENCOUNTER — Encounter: Payer: Self-pay | Admitting: Family Medicine

## 2020-12-25 ENCOUNTER — Other Ambulatory Visit: Payer: Self-pay | Admitting: Family Medicine

## 2020-12-25 DIAGNOSIS — E559 Vitamin D deficiency, unspecified: Secondary | ICD-10-CM

## 2020-12-25 LAB — CBC WITH DIFFERENTIAL/PLATELET
Basophils Absolute: 0 10*3/uL (ref 0.0–0.2)
Basos: 0 %
EOS (ABSOLUTE): 0.1 10*3/uL (ref 0.0–0.4)
Eos: 1 %
Hematocrit: 39.1 % (ref 34.0–46.6)
Hemoglobin: 13.1 g/dL (ref 11.1–15.9)
Immature Grans (Abs): 0 10*3/uL (ref 0.0–0.1)
Immature Granulocytes: 0 %
Lymphocytes Absolute: 2.6 10*3/uL (ref 0.7–3.1)
Lymphs: 29 %
MCH: 31.9 pg (ref 26.6–33.0)
MCHC: 33.5 g/dL (ref 31.5–35.7)
MCV: 95 fL (ref 79–97)
Monocytes Absolute: 0.6 10*3/uL (ref 0.1–0.9)
Monocytes: 6 %
Neutrophils Absolute: 5.9 10*3/uL (ref 1.4–7.0)
Neutrophils: 64 %
Platelets: 409 10*3/uL (ref 150–450)
RBC: 4.11 x10E6/uL (ref 3.77–5.28)
RDW: 12.4 % (ref 11.7–15.4)
WBC: 9.2 10*3/uL (ref 3.4–10.8)

## 2020-12-25 LAB — COMPREHENSIVE METABOLIC PANEL
ALT: 9 IU/L (ref 0–32)
AST: 14 IU/L (ref 0–40)
Albumin/Globulin Ratio: 1.2 (ref 1.2–2.2)
Albumin: 4.2 g/dL (ref 3.8–4.9)
Alkaline Phosphatase: 84 IU/L (ref 44–121)
BUN/Creatinine Ratio: 14 (ref 9–23)
BUN: 15 mg/dL (ref 6–24)
Bilirubin Total: 0.4 mg/dL (ref 0.0–1.2)
CO2: 20 mmol/L (ref 20–29)
Calcium: 10 mg/dL (ref 8.7–10.2)
Chloride: 104 mmol/L (ref 96–106)
Creatinine, Ser: 1.08 mg/dL — ABNORMAL HIGH (ref 0.57–1.00)
Globulin, Total: 3.6 g/dL (ref 1.5–4.5)
Glucose: 83 mg/dL (ref 65–99)
Potassium: 4.2 mmol/L (ref 3.5–5.2)
Sodium: 140 mmol/L (ref 134–144)
Total Protein: 7.8 g/dL (ref 6.0–8.5)
eGFR: 61 mL/min/{1.73_m2} (ref >59)

## 2020-12-25 LAB — LIPID PANEL
Chol/HDL Ratio: 4.3 ratio (ref 0.0–4.4)
Cholesterol, Total: 199 mg/dL (ref 100–199)
HDL: 46 mg/dL (ref >39)
LDL Chol Calc (NIH): 136 mg/dL — ABNORMAL HIGH (ref 0–99)
Triglycerides: 96 mg/dL (ref 0–149)
VLDL Cholesterol Cal: 17 mg/dL (ref 5–40)

## 2020-12-25 LAB — HEMOGLOBIN A1C
Est. average glucose Bld gHb Est-mCnc: 111 mg/dL
Hgb A1c MFr Bld: 5.5 % (ref 4.8–5.6)

## 2020-12-25 LAB — TSH: TSH: 1.28 u[IU]/mL (ref 0.450–4.500)

## 2020-12-25 LAB — IRON AND TIBC
Iron Saturation: 18 % (ref 15–55)
Iron: 70 ug/dL (ref 27–159)
Total Iron Binding Capacity: 383 ug/dL (ref 250–450)
UIBC: 313 ug/dL (ref 131–425)

## 2020-12-25 LAB — VITAMIN D 25 HYDROXY (VIT D DEFICIENCY, FRACTURES): Vit D, 25-Hydroxy: 14.8 ng/mL — ABNORMAL LOW (ref 30.0–100.0)

## 2020-12-25 LAB — VITAMIN B12: Vitamin B-12: 233 pg/mL (ref 232–1245)

## 2020-12-25 MED ORDER — VITAMIN D (ERGOCALCIFEROL) 1.25 MG (50000 UNIT) PO CAPS: 50000.0000 [IU] | ORAL_CAPSULE | ORAL | 3 refills | Status: DC

## 2020-12-27 ENCOUNTER — Encounter: Payer: Self-pay | Admitting: Family Medicine

## 2020-12-29 ENCOUNTER — Encounter: Payer: Self-pay | Admitting: Family Medicine

## 2020-12-29 ENCOUNTER — Other Ambulatory Visit: Payer: Self-pay

## 2020-12-29 ENCOUNTER — Ambulatory Visit (INDEPENDENT_AMBULATORY_CARE_PROVIDER_SITE_OTHER): Payer: Self-pay | Admitting: Family Medicine

## 2020-12-29 VITALS — BP 134/87 | HR 96 | Ht 65.0 in | Wt 218.0 lb

## 2020-12-29 DIAGNOSIS — I10 Essential (primary) hypertension: Secondary | ICD-10-CM

## 2020-12-29 DIAGNOSIS — F419 Anxiety disorder, unspecified: Secondary | ICD-10-CM

## 2020-12-29 DIAGNOSIS — Z09 Encounter for follow-up examination after completed treatment for conditions other than malignant neoplasm: Secondary | ICD-10-CM

## 2020-12-29 DIAGNOSIS — Z0000 Encounter for general adult medical examination without abnormal findings: Secondary | ICD-10-CM

## 2020-12-29 DIAGNOSIS — Y93E6 Activity, residential relocation: Secondary | ICD-10-CM

## 2020-12-29 NOTE — Progress Notes (Signed)
Patient [name]  Annual Physical  Subjective:  Patient ID: Victoria Curry, female    DOB: [birth year]  Age: 55 y.o. MRN: [id number]  CC:  Chief Complaint  Patient presents with  . Follow-up    Form completion    HPI Victoria Curry is a 55 year old female who presents for Annual Physical today.    Patient Active Problem List   Diagnosis Date Noted  . Fibroid uterus 08/03/2017  . Menorrhagia with regular cycle 08/03/2017  . Acute non intractable tension-type headache 12/25/2016  . Nausea 12/25/2016  . Masses of both breasts 12/29/2015  . Absolute anemia 11/13/2015  . Essential hypertension 10/09/2015  . Gastroesophageal reflux disease without esophagitis 10/09/2015  . Metabolic syndrome 51/11/5850  . Fibromyalgia 10/09/2015  . Depression 10/09/2015    Current Status: Since her last office visit, she is doing well with no complaints. She denies fevers, chills, fatigue, recent infections, weight loss, and night sweats. She has not had any headaches, visual changes, dizziness, and falls. No chest pain, heart palpitations, cough and shortness of breath reported. Denies GI problems such as nausea, vomiting, diarrhea, and constipation. She has no reports of blood in stools, dysuria and hematuria. No depression or anxiety reported today. She is taking all medications as prescribed. She denies pain today.   Past Medical History:  Diagnosis Date  . Depression   . Fibromyalgia   . GERD (gastroesophageal reflux disease)   . Hypertension   . Vitamin D deficiency 12/2020    Past Surgical History:  Procedure Laterality Date  . BREAST LUMPECTOMY      Family History  Problem Relation Age of Onset  . Diabetes Mother   . Hypertension Mother   . Alzheimer's disease Mother   . Cancer Mother   . Heart disease Mother   . Hypertension Father   . Heart disease Father   . Cancer Father        testiculiar     Social History    Socioeconomic History  . Marital status: Divorced    Spouse name: Not on file  . Number of children: Not on file  . Years of education: Not on file  . Highest education level: Not on file  Occupational History  . Not on file  Tobacco Use  . Smoking status: Former Research scientist (life sciences)  . Smokeless tobacco: Never Used  Vaping Use  . Vaping Use: Never used  Substance and Sexual Activity  . Alcohol use: No  . Drug use: No  . Sexual activity: Yes  Other Topics Concern  . Not on file  Social History Narrative  . Not on file   Social Determinants of Health   Financial Resource Strain: Not on file  Food Insecurity: Not on file  Transportation Needs: Not on file  Physical Activity: Not on file  Stress: Not on file  Social Connections: Not on file  Intimate Partner Violence: Not on file    Outpatient Medications Prior to Visit  Medication Sig Dispense Refill  . acetaminophen (TYLENOL) 650 MG CR tablet Take 650 mg by mouth every 8 (eight) hours as needed for pain.    Marland Kitchen amLODipine (NORVASC) 10 MG tablet Take 1 tablet (10 mg total) by mouth daily. 90 tablet 3  . busPIRone (BUSPAR) 15 MG tablet Take 1 tablet (15 mg total) by mouth 2 (two) times daily. Must make appt for more refills. 180 tablet 3  . DULoxetine (CYMBALTA) 30 MG capsule  Take 3 capsules (90 mg total) by mouth daily. 90 capsule 3  . ferrous fumarate (HEMOCYTE - 106 MG FE) 325 (106 Fe) MG TABS tablet Take 1 tablet (106 mg of iron total) by mouth daily. 30 tablet 2  . hydrOXYzine (ATARAX/VISTARIL) 25 MG tablet Take 1 tablet (25 mg total) by mouth 3 (three) times daily as needed. 90 tablet 6  . ibuprofen (ADVIL,MOTRIN) 600 MG tablet Take 1 tablet (600 mg total) by mouth every 8 (eight) hours as needed. 30 tablet 2  . losartan (COZAAR) 100 MG tablet Take 1 tablet (100 mg total) by mouth daily. 90 tablet 3  . norethindrone-ethinyl estradiol-iron (MICROGESTIN FE 1.5/30) 1.5-30 MG-MCG tablet Take 1 tablet by mouth daily. 3 Package 3  .  ondansetron (ZOFRAN-ODT) 8 MG disintegrating tablet Take 1 tablet (8 mg total) by mouth every 8 (eight) hours as needed for nausea or vomiting. 20 tablet 1  . Vitamin D, Ergocalciferol, (DRISDOL) 1.25 MG (50000 UNIT) CAPS capsule Take 1 capsule (50,000 Units total) by mouth every 7 (seven) days. 5 capsule 3   No facility-administered medications prior to visit.    Allergies  Allergen Reactions  . Pineapple Swelling  . Adhesive [Tape] Other (See Comments)    Skin irritation   . Codeine Itching  . Latex Other (See Comments)    Skin irritation   . Shellfish Allergy     ROS Review of Systems  Constitutional: Negative.   HENT: Negative.   Eyes: Negative.   Respiratory: Negative.   Cardiovascular: Negative.   Gastrointestinal: Negative.   Endocrine: Negative.   Genitourinary: Negative.   Musculoskeletal: Positive for arthralgias (generalized ).  Skin: Negative.   Allergic/Immunologic: Negative.   Neurological: Positive for dizziness (occasional ) and headaches (occasional).  Hematological: Negative.   Psychiatric/Behavioral: Negative.       Objective:    Physical Exam Vitals and nursing note reviewed.  Constitutional:      Appearance: Normal appearance.  HENT:     Head: Normocephalic and atraumatic.     Nose: Nose normal.     Mouth/Throat:     Mouth: Mucous membranes are moist.     Pharynx: Oropharynx is clear.  Cardiovascular:     Rate and Rhythm: Normal rate and regular rhythm.  Pulmonary:     Effort: Pulmonary effort is normal.     Breath sounds: Normal breath sounds.  Abdominal:     General: Bowel sounds are normal.     Palpations: Abdomen is soft.  Musculoskeletal:        General: Normal range of motion.     Cervical back: Normal range of motion and neck supple.  Skin:    General: Skin is warm and dry.  Neurological:     General: No focal deficit present.     Mental Status: She is alert and oriented to person, place, and time.  Psychiatric:        Mood  and Affect: Mood normal.        Behavior: Behavior normal.        Thought Content: Thought content normal.        Judgment: Judgment normal.     BP 134/87   Pulse 96   Ht 5\' 5"  (1.651 m)   Wt 218 lb (98.9 kg)   SpO2 100%   BMI 36.28 kg/m  Wt Readings from Last 3 Encounters:  12/29/20 218 lb (98.9 kg)  12/24/20 219 lb (99.3 kg)  07/02/19 211 lb (95.7 kg)  Health Maintenance Due  Topic Date Due  . Hepatitis C Screening  Never done  . COLONOSCOPY (Pts 45-79yrs Insurance coverage will need to be confirmed)  Never done  . MAMMOGRAM  01/20/2018  . PAP SMEAR-Modifier  09/21/2019  . COVID-19 Vaccine (3 - Booster for Pfizer series) 08/27/2020    There are no preventive care reminders to display for this patient.  Lab Results  Component Value Date   TSH 1.280 12/24/2020   Lab Results  Component Value Date   WBC 9.2 12/24/2020   HGB 13.1 12/24/2020   HCT 39.1 12/24/2020   MCV 95 12/24/2020   PLT 409 12/24/2020   Lab Results  Component Value Date   NA 140 12/24/2020   K 4.2 12/24/2020   CO2 20 12/24/2020   GLUCOSE 83 12/24/2020   BUN 15 12/24/2020   CREATININE 1.08 (H) 12/24/2020   BILITOT 0.4 12/24/2020   ALKPHOS 84 12/24/2020   AST 14 12/24/2020   ALT 9 12/24/2020   PROT 7.8 12/24/2020   ALBUMIN 4.2 12/24/2020   CALCIUM 10.0 12/24/2020   Lab Results  Component Value Date   CHOL 199 12/24/2020   Lab Results  Component Value Date   HDL 46 12/24/2020   Lab Results  Component Value Date   LDLCALC 136 (H) 12/24/2020   Lab Results  Component Value Date   TRIG 96 12/24/2020   Lab Results  Component Value Date   CHOLHDL 4.3 12/24/2020   Lab Results  Component Value Date   HGBA1C 5.5 12/24/2020      Assessment & Plan:   1. Annual physical exam Physical assessment within normal for age. Basic Neurology assessment normal.  Follow-up for scheduled mammogram as needed.  Recommend monthly self breast exam Recommend daily multivitamin for  women Recommend strength training in 150 minutes of cardiovascular exercise per week  2. Essential hypertension The current medical regimen is effective; blood pressure is stable at 134/87 today; continue present plan and medications as prescribed. She will continue to take medications as prescribed, to decrease high sodium intake, excessive alcohol intake, increase potassium intake, smoking cessation, and increase physical activity of at least 30 minutes of cardio activity daily. She will continue to follow Heart Healthy or DASH diet.  3. Residential relocation  4. Anxiety  5. Follow up She will keep follow up appointment as scheduled.   No orders of the defined types were placed in this encounter.   No orders of the defined types were placed in this encounter.   Referral Orders  No referral(s) requested today    Kathe Becton, MSN, ANE, FNP-BC [facility] [address] [place], [place] [address] [phone]- fax   Problem List Items Addressed This Visit      Cardiovascular and Mediastinum   Essential hypertension    Other Visit Diagnoses    Annual physical exam    -  Primary   Residential relocation       Anxiety       Follow up          No orders of the defined types were placed in this encounter.   Follow-up: No follow-ups on file.    Azzie Glatter, FNP

## 2020-12-30 LAB — URINALYSIS
Bilirubin, UA: NEGATIVE
Glucose, UA: NEGATIVE
Leukocytes,UA: NEGATIVE
Nitrite, UA: NEGATIVE
Specific Gravity, UA: 1.027 (ref 1.005–1.030)
Urobilinogen, Ur: 1 mg/dL (ref 0.2–1.0)
pH, UA: 6 (ref 5.0–7.5)

## 2021-01-05 ENCOUNTER — Encounter: Payer: Self-pay | Admitting: Family Medicine

## 2021-02-07 ENCOUNTER — Encounter: Payer: Self-pay | Admitting: Family Medicine

## 2021-02-25 ENCOUNTER — Ambulatory Visit: Payer: Self-pay | Admitting: Family Medicine

## 2021-03-03 ENCOUNTER — Encounter: Payer: Self-pay | Admitting: Nurse Practitioner

## 2021-03-04 ENCOUNTER — Other Ambulatory Visit: Payer: Self-pay | Admitting: Nurse Practitioner

## 2021-03-04 DIAGNOSIS — Z309 Encounter for contraceptive management, unspecified: Secondary | ICD-10-CM

## 2021-03-04 MED ORDER — NORETHIN ACE-ETH ESTRAD-FE 1.5-30 MG-MCG PO TABS
1.0000 | ORAL_TABLET | Freq: Every day | ORAL | 3 refills | Status: DC
Start: 2021-03-04 — End: 2022-01-09

## 2021-03-04 NOTE — Progress Notes (Signed)
   Victoria Curry, [place]  [address] Phone:  [phone]   Fax:  [phone] Refill sent per patients request.

## 2021-04-15 ENCOUNTER — Telehealth: Payer: Self-pay

## 2021-04-15 NOTE — Telephone Encounter (Signed)
Pt asking for a LETTER for emotional support  She stated that Kathe Becton was suppose to give it to her.

## 2022-01-06 ENCOUNTER — Other Ambulatory Visit: Payer: Self-pay

## 2022-01-09 ENCOUNTER — Telehealth: Payer: Self-pay | Admitting: Physician Assistant

## 2022-01-09 DIAGNOSIS — E611 Iron deficiency: Secondary | ICD-10-CM

## 2022-01-09 DIAGNOSIS — E559 Vitamin D deficiency, unspecified: Secondary | ICD-10-CM

## 2022-01-09 DIAGNOSIS — I10 Essential (primary) hypertension: Secondary | ICD-10-CM

## 2022-01-09 DIAGNOSIS — F419 Anxiety disorder, unspecified: Secondary | ICD-10-CM

## 2022-01-09 DIAGNOSIS — F329 Major depressive disorder, single episode, unspecified: Secondary | ICD-10-CM

## 2022-01-09 DIAGNOSIS — Z3041 Encounter for surveillance of contraceptive pills: Secondary | ICD-10-CM

## 2022-01-09 MED ORDER — BUSPIRONE HCL 15 MG PO TABS: 15.0000 mg | ORAL_TABLET | Freq: Two times a day (BID) | ORAL | 0 refills | Status: DC

## 2022-01-09 MED ORDER — FERROUS FUMARATE 325 (106 FE) MG PO TABS: 1.0000 | ORAL_TABLET | Freq: Every day | ORAL | 0 refills | Status: DC

## 2022-01-09 MED ORDER — VITAMIN D (ERGOCALCIFEROL) 1.25 MG (50000 UNIT) PO CAPS: 50000.0000 [IU] | ORAL_CAPSULE | ORAL | 0 refills | Status: DC

## 2022-01-09 MED ORDER — HYDROXYZINE HCL 25 MG PO TABS: 25.0000 mg | ORAL_TABLET | Freq: Three times a day (TID) | ORAL | 0 refills | Status: DC | PRN

## 2022-01-09 MED ORDER — AMLODIPINE BESYLATE 10 MG PO TABS: 10.0000 mg | ORAL_TABLET | Freq: Every day | ORAL | 0 refills | Status: DC

## 2022-01-09 MED ORDER — NORETHIN ACE-ETH ESTRAD-FE 1.5-30 MG-MCG PO TABS: 1.0000 | ORAL_TABLET | Freq: Every day | ORAL | 0 refills | Status: DC

## 2022-01-09 MED ORDER — DULOXETINE HCL 30 MG PO CPEP: 90.0000 mg | ORAL_CAPSULE | Freq: Every day | ORAL | 0 refills | Status: DC

## 2022-01-09 MED ORDER — LOSARTAN POTASSIUM 100 MG PO TABS: 100.0000 mg | ORAL_TABLET | Freq: Every day | ORAL | 0 refills | Status: DC

## 2022-01-09 NOTE — Patient Instructions (Addendum)
?[name], thank you for joining Mar Daring, PA-C for today's virtual visit.  While this provider is not your primary care provider (PCP), if your PCP is located in our provider database this encounter information will be shared with them immediately following your visit. ? ?Consent: ?(Patient) Victoria Curry provided verbal consent for this virtual visit at the beginning of the encounter. ? ?Current Medications: ? ?Current Outpatient Medications:  ?  acetaminophen (TYLENOL) 650 MG CR tablet, Take 650 mg by mouth every 8 (eight) hours as needed for pain., Disp: , Rfl:  ?  amLODipine (NORVASC) 10 MG tablet, Take 1 tablet (10 mg total) by mouth daily., Disp: 30 tablet, Rfl: 0 ?  busPIRone (BUSPAR) 15 MG tablet, Take 1 tablet (15 mg total) by mouth 2 (two) times daily. Must make appt for more refills., Disp: 60 tablet, Rfl: 0 ?  DULoxetine (CYMBALTA) 30 MG capsule, Take 3 capsules (90 mg total) by mouth daily., Disp: 90 capsule, Rfl: 0 ?  ferrous fumarate (HEMOCYTE - 106 MG FE) 325 (106 Fe) MG TABS tablet, Take 1 tablet (106 mg of iron total) by mouth daily., Disp: 30 tablet, Rfl: 0 ?  hydrOXYzine (ATARAX) 25 MG tablet, Take 1 tablet (25 mg total) by mouth 3 (three) times daily as needed., Disp: 90 tablet, Rfl: 0 ?  ibuprofen (ADVIL,MOTRIN) 600 MG tablet, Take 1 tablet (600 mg total) by mouth every 8 (eight) hours as needed., Disp: 30 tablet, Rfl: 2 ?  losartan (COZAAR) 100 MG tablet, Take 1 tablet (100 mg total) by mouth daily., Disp: 30 tablet, Rfl: 0 ?  norethindrone-ethinyl estradiol-iron (MICROGESTIN FE 1.5/30) 1.5-30 MG-MCG tablet, Take 1 tablet by mouth daily., Disp: 30 tablet, Rfl: 0 ?  Vitamin D, Ergocalciferol, (DRISDOL) 1.25 MG (50000 UNIT) CAPS capsule, Take 1 capsule (50,000 Units total) by mouth every 7 (seven) days., Disp: 5 capsule, Rfl: 0  ? ?Medications ordered in this encounter:  ?Meds ordered this encounter  ?Medications  ? amLODipine (NORVASC) 10 MG tablet  ?  Sig: Take 1 tablet  (10 mg total) by mouth daily.  ?  Dispense:  30 tablet  ?  Refill:  0  ?  Order Specific Question:   Supervising Provider  ?  Answer:   Noemi Chapel [[id number]]  ? busPIRone (BUSPAR) 15 MG tablet  ?  Sig: Take 1 tablet (15 mg total) by mouth 2 (two) times daily. Must make appt for more refills.  ?  Dispense:  60 tablet  ?  Refill:  0  ?  Order Specific Question:   Supervising Provider  ?  Answer:   Noemi Chapel [[id number]]  ? DULoxetine (CYMBALTA) 30 MG capsule  ?  Sig: Take 3 capsules (90 mg total) by mouth daily.  ?  Dispense:  90 capsule  ?  Refill:  0  ?  Order Specific Question:   Supervising Provider  ?  Answer:   Noemi Chapel [[id number]]  ? hydrOXYzine (ATARAX) 25 MG tablet  ?  Sig: Take 1 tablet (25 mg total) by mouth 3 (three) times daily as needed.  ?  Dispense:  90 tablet  ?  Refill:  0  ?  Order Specific Question:   Supervising Provider  ?  Answer:   Noemi Chapel [[id number]]  ? ferrous fumarate (HEMOCYTE - 106 MG FE) 325 (106 Fe) MG TABS tablet  ?  Sig: Take 1 tablet (106 mg of iron total) by mouth daily.  ?  Dispense:  30 tablet  ?  Refill:  0  ?  Order Specific Question:   Supervising Provider  ?  Answer:   Noemi Chapel [[id number]]  ? losartan (COZAAR) 100 MG tablet  ?  Sig: Take 1 tablet (100 mg total) by mouth daily.  ?  Dispense:  30 tablet  ?  Refill:  0  ?  Order Specific Question:   Supervising Provider  ?  Answer:   Noemi Chapel [[id number]]  ? norethindrone-ethinyl estradiol-iron (MICROGESTIN FE 1.5/30) 1.5-30 MG-MCG tablet  ?  Sig: Take 1 tablet by mouth daily.  ?  Dispense:  30 tablet  ?  Refill:  0  ?  Order Specific Question:   Supervising Provider  ?  Answer:   Noemi Chapel [[id number]]  ? Vitamin D, Ergocalciferol, (DRISDOL) 1.25 MG (50000 UNIT) CAPS capsule  ?  Sig: Take 1 capsule (50,000 Units total) by mouth every 7 (seven) days.  ?  Dispense:  5 capsule  ?  Refill:  0  ?  Order Specific Question:   Supervising Provider  ?  Answer:   Noemi Chapel [[id number]]  ?  ? ?*If you need refills on other medications prior to  your next appointment, please contact your pharmacy* ? ?Follow-Up: ?Call back or seek an in-person evaluation if the symptoms worsen or if the condition fails to improve as anticipated. ? ?Other Instructions ? ?One month of medications have been supplied emergently ?Call [phone] to schedule appt with PCP ? ? ?If you have been instructed to have an in-person evaluation today at a local Urgent Care facility, please use the link below. It will take you to a list of all of our available [facility], including address, phone number and hours of operation. Please do not delay care.  ?[facility] ? ?If you or a family member do not have a primary care provider, use the link below to schedule a visit and establish care. When you choose a [facility] primary care physician or advanced practice provider, you gain a long-term partner in health. ?Find a Primary Care Provider ? ?Learn more about [facility]'s in-office and virtual care options: ?[facility] ?

## 2022-01-09 NOTE — Progress Notes (Signed)
?Virtual Visit Consent  ? ?[name], you are scheduled for a virtual visit with a [facility] provider today.   ?  ?Just as with appointments in the office, your consent must be obtained to participate.  Your consent will be active for this visit and any virtual visit you may have with one of our providers in the next 365 days.   ?  ?If you have a MyChart account, a copy of this consent can be sent to you electronically.  All virtual visits are billed to your insurance company just like a traditional visit in the office.   ? ?As this is a virtual visit, video technology does not allow for your provider to perform a traditional examination.  This may limit your provider's ability to fully assess your condition.  If your provider identifies any concerns that need to be evaluated in person or the need to arrange testing (such as labs, EKG, etc.), we will make arrangements to do so.   ?  ?Although advances in technology are sophisticated, we cannot ensure that it will always work on either your end or our end.  If the connection with a video visit is poor, the visit may have to be switched to a telephone visit.  With either a video or telephone visit, we are not always able to ensure that we have a secure connection.    ? ?I need to obtain your verbal consent now.   Are you willing to proceed with your visit today?  ?  ?[name] has provided verbal consent on 01/09/2022 for a virtual visit (video or telephone). ?  ?Mar Daring, PA-C  ? ?Date: 01/09/2022 4:07 PM ? ? ?Virtual Visit via Video Note  ? ?Victoria, Curry, connected with  NOTNAMED CROUCHER  ([id number], 27-Jun-1966) on 01/09/22 at  3:30 PM EDT by a video-enabled telemedicine application and verified that I am speaking with the correct person using two identifiers. ? ?Location: ?Patient: Work; isolated ?Provider: Virtual Visit Location Provider: Home Office ?  ?I discussed the limitations of evaluation and management by telemedicine and  the availability of in person appointments. The patient expressed understanding and agreed to proceed.   ? ?History of Present Illness: ?Victoria Curry is a 56 y.o. who identifies as a female who was assigned female at birth, and is being seen today for medication refills. She has been out of medications for a few weeks and reports symptoms have been worsening. She is unable to schedule with her PCP until mid April due to work. 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 Was tolerating medications fine. ? ? ?Problems:  ?Patient Active Problem List  ? Diagnosis Date Noted  ? Fibroid uterus 08/03/2017  ? Menorrhagia with regular cycle 08/03/2017  ? Acute non intractable tension-type headache 12/25/2016  ? Nausea 12/25/2016  ? Masses of both breasts 12/29/2015  ? Absolute anemia 11/13/2015  ? Essential hypertension 10/09/2015  ? Gastroesophageal reflux disease without esophagitis 10/09/2015  ? Metabolic syndrome 35/46/5681  ? Fibromyalgia 10/09/2015  ? Depression 10/09/2015  ?  ?Allergies:  ?Allergies  ?Allergen Reactions  ? Pineapple Swelling  ? Adhesive [Tape] Other (See Comments)  ?  Skin irritation   ? Codeine Itching  ? Latex Other (See Comments)  ?  Skin irritation ?  ? Shellfish Allergy   ? ?Medications:  ?Current Outpatient Medications:  ?  acetaminophen (TYLENOL) 650 MG CR tablet, Take 650 mg by mouth every 8 (eight) hours as needed for pain., Disp: , Rfl:  ?  amLODipine (NORVASC) 10 MG tablet, Take 1 tablet (10 mg total) by mouth daily., Disp: 30 tablet, Rfl: 0 ?  busPIRone (BUSPAR) 15 MG tablet, Take 1 tablet (15 mg total) by mouth 2 (two) times daily. Must make appt for more refills., Disp: 60 tablet, Rfl: 0 ?  DULoxetine (CYMBALTA) 30 MG capsule, Take 3 capsules (90 mg total) by mouth daily., Disp: 90 capsule, Rfl: 0 ?  ferrous fumarate (HEMOCYTE - 106 MG FE) 325 (106 Fe) MG TABS tablet, Take 1 tablet (106 mg of iron total)  by mouth daily., Disp: 30 tablet, Rfl: 0 ?  hydrOXYzine (ATARAX) 25 MG tablet, Take 1 tablet (25 mg total) by mouth 3 (three) times daily as needed., Disp: 90 tablet, Rfl: 0 ?  ibuprofen (ADVIL,MOTRIN) 600 MG tablet, Take 1 tablet (600 mg total) by mouth every 8 (eight) hours as needed., Disp: 30 tablet, Rfl: 2 ?  losartan (COZAAR) 100 MG tablet, Take 1 tablet (100 mg total) by mouth daily., Disp: 30 tablet, Rfl: 0 ?  norethindrone-ethinyl estradiol-iron (MICROGESTIN FE 1.5/30) 1.5-30 MG-MCG tablet, Take 1 tablet by mouth daily., Disp: 30 tablet, Rfl: 0 ?  Vitamin D, Ergocalciferol, (DRISDOL) 1.25 MG (50000 UNIT) CAPS capsule, Take 1 capsule (50,000 Units total) by mouth every 7 (seven) days., Disp: 5 capsule, Rfl: 0 ? ?Observations/Objective: ?Patient is well-developed, well-nourished in no acute distress.  ?Resting comfortably at work; isolated ?Head is normocephalic, atraumatic.  ?No labored breathing. ?Speech is clear and coherent with logical content.  ?Patient is alert and oriented at baseline.  ? ? ?Assessment and Plan: ?1. Essential hypertension ?- amLODipine (NORVASC) 10 MG tablet; Take 1 tablet (10 mg total) by mouth daily.  Dispense: 30 tablet; Refill: 0 ?- losartan (COZAAR) 100 MG tablet; Take 1 tablet (100 mg total) by mouth daily.  Dispense: 30 tablet; Refill: 0 ? ?2. Anxiety ?- busPIRone (BUSPAR) 15 MG tablet; Take 1 tablet (15 mg total) by mouth 2 (two) times daily. Must make appt for more refills.  Dispense: 60 tablet; Refill: 0 ?- hydrOXYzine (ATARAX) 25 MG tablet; Take 1 tablet (25 mg total) by mouth 3 (three) times daily as needed.  Dispense: 90 tablet; Refill: 0 ? ?3. Reactive depression ?- DULoxetine (CYMBALTA) 30 MG capsule; Take 3 capsules (90 mg total) by mouth daily.  Dispense: 90 capsule; Refill: 0 ?- hydrOXYzine (ATARAX) 25 MG tablet; Take 1 tablet (25 mg total) by mouth 3 (three) times daily as needed.  Dispense: 90 tablet; Refill: 0 ? ?4. Encounter for surveillance of contraceptive  pills ?- norethindrone-ethinyl estradiol-iron (MICROGESTIN FE 1.5/30) 1.5-30 MG-MCG tablet; Take 1 tablet by mouth daily.  Dispense: 30 tablet; Refill: 0 ? ?5. Vitamin D deficiency ?- Vitamin D, Ergocalciferol, (DRISDOL) 1.25 MG (50000 UNIT) CAPS capsule; Take 1 capsule (50,000 Units total) by mouth every 7 (seven) days.  Dispense: 5 capsule; Refill: 0 ? ?6. Iron deficiency ?- ferrous fumarate (HEMOCYTE - 106 MG FE) 325 (106 Fe) MG TABS tablet; Take 1 tablet (106 mg of iron total) by mouth daily.  Dispense: 30 tablet; Refill: 0 ? ?- Medications refilled x 1 month ?- Discussed titrating up on Duloxetine (start with 1 capsule x 1 week, then increase 1 capsule weekly until max dose 3 capsules daily reached) ?- Titrate up Buspirone (Start with 1 tablet x 1 week then increase to 2 tab daily) ?-  Titrate Amlodipine (start 0.5 tab x 1 week, then increase to 1 tab daily) ?- Titrate Losartan (start 0.5 tab x 1 week, then increase to 1 tab daily) ?- Hydroxyzine as needed ?- Call PCP office to schedule CPE or medication f/u week of spring break ? ?Follow Up Instructions: ?I discussed the assessment and treatment plan with the patient. The patient was provided an opportunity to ask questions and all were answered. The patient agreed with the plan and demonstrated an understanding of the instructions.  A copy of instructions were sent to the patient via MyChart unless otherwise noted below.  ? ? ?The patient was advised to call back or seek an in-person evaluation if the symptoms worsen or if the condition fails to improve as anticipated. ? ?Time:  ?I spent 30 minutes with the patient via telehealth technology discussing the above problems/concerns.   ? ?Mar Daring, PA-C ?

## 2022-01-25 ENCOUNTER — Encounter: Payer: Self-pay | Admitting: Nurse Practitioner

## 2022-01-25 ENCOUNTER — Ambulatory Visit (INDEPENDENT_AMBULATORY_CARE_PROVIDER_SITE_OTHER): Payer: Self-pay | Admitting: Nurse Practitioner

## 2022-01-25 VITALS — BP 123/93 | HR 80 | Temp 98.1°F | Ht 65.0 in | Wt 208.2 lb

## 2022-01-25 DIAGNOSIS — F419 Anxiety disorder, unspecified: Secondary | ICD-10-CM

## 2022-01-25 DIAGNOSIS — Z3041 Encounter for surveillance of contraceptive pills: Secondary | ICD-10-CM

## 2022-01-25 DIAGNOSIS — F329 Major depressive disorder, single episode, unspecified: Secondary | ICD-10-CM

## 2022-01-25 DIAGNOSIS — I10 Essential (primary) hypertension: Secondary | ICD-10-CM

## 2022-01-25 DIAGNOSIS — Z0000 Encounter for general adult medical examination without abnormal findings: Secondary | ICD-10-CM

## 2022-01-25 DIAGNOSIS — E559 Vitamin D deficiency, unspecified: Secondary | ICD-10-CM

## 2022-01-25 DIAGNOSIS — E611 Iron deficiency: Secondary | ICD-10-CM

## 2022-01-25 MED ORDER — NORETHIN ACE-ETH ESTRAD-FE 1.5-30 MG-MCG PO TABS: 1.0000 | ORAL_TABLET | Freq: Every day | ORAL | 0 refills | Status: DC

## 2022-01-25 MED ORDER — HYDROXYZINE HCL 25 MG PO TABS: 25.0000 mg | ORAL_TABLET | Freq: Three times a day (TID) | ORAL | 0 refills | Status: DC | PRN

## 2022-01-25 MED ORDER — DULOXETINE HCL 30 MG PO CPEP: 90.0000 mg | ORAL_CAPSULE | Freq: Every day | ORAL | 0 refills | Status: DC

## 2022-01-25 MED ORDER — BUSPIRONE HCL 15 MG PO TABS: 15.0000 mg | ORAL_TABLET | Freq: Two times a day (BID) | ORAL | 0 refills | Status: DC

## 2022-01-25 MED ORDER — FERROUS FUMARATE 325 (106 FE) MG PO TABS: 1.0000 | ORAL_TABLET | Freq: Every day | ORAL | 0 refills | Status: DC

## 2022-01-25 MED ORDER — LOSARTAN POTASSIUM 100 MG PO TABS: 100.0000 mg | ORAL_TABLET | Freq: Every day | ORAL | 0 refills | Status: DC

## 2022-01-25 MED ORDER — AMLODIPINE BESYLATE 10 MG PO TABS: 10.0000 mg | ORAL_TABLET | Freq: Every day | ORAL | 0 refills | Status: DC

## 2022-01-25 MED ORDER — VITAMIN D (ERGOCALCIFEROL) 1.25 MG (50000 UNIT) PO CAPS: 50000.0000 [IU] | ORAL_CAPSULE | ORAL | 2 refills | Status: AC

## 2022-01-25 NOTE — Progress Notes (Signed)
Integrated Behavioral Health ?Case Management Referral Note ? ?01/25/2022 ?Name: Victoria Curry MRN: [id number] DOB: 1966-08-21 ?Victoria Curry is a 56 y.o. year old female who sees Azzie Glatter, FNP for primary care. LCSW was consulted to assess patient's needs and assist the patient with [employer] . ? ?Interpreter: No.   Interpreter Name & Language: none ? ?Assessment: Patient experiencing Financial constraints related to low income and lack of health coverage and Housing barriers.  ? ?Intervention: CSW met with patient during PCP visit. Patient is uninsured and also facing housing barriers. She works and her partner also just started a new job. They are currently staying in a hotel. Despite working, they have challenges affording housing. Patient has two adult children who have been supportive. Provided patient information on low income housing search resources. Patient is on [facility] section 8 waiting list. CSW will plan to find additional information about [place] housing authority wait lists, as patient would be agreeable to live in that area as well.  ? ?Provided patient with [employer] and [employer] applications. Reviewed application and advised patient on supporting documents to be submitted with the application. Advised patient to follow up with CSW for assistance in submitting the applications. Provided CSW contact information.  ? ? ?SDOH (Social Determinants of Health) assessments performed: No ?SDOH Interventions   ? ?Flowsheet Row Most Recent Value  ?SDOH Interventions   ?Depression Interventions/Treatment  Medication  ? ?  ?  ? ? ? ?Review of patient status, including review of consultants reports, relevant laboratory and other test results, and collaboration with appropriate care team members and the patient's provider was performed as part of comprehensive patient evaluation and provision of services.   ? ?Estanislado Emms, LCSW ?[name] ?[facility] ?[phone] ?  ? ?

## 2022-01-25 NOTE — Assessment & Plan Note (Signed)
-   Vitamin D, 25-hydroxy ? ?2. Iron deficiency ? ?- ferrous fumarate (HEMOCYTE - 106 MG FE) 325 (106 Fe) MG TABS tablet; Take 1 tablet (106 mg of iron total) by mouth daily.  Dispense: 30 tablet; Refill: 0 ? ?3. Encounter for surveillance of contraceptive pills ? ?- norethindrone-ethinyl estradiol-iron (MICROGESTIN FE 1.5/30) 1.5-30 MG-MCG tablet; Take 1 tablet by mouth daily.  Dispense: 30 tablet; Refill: 0 ? ?4. Essential hypertension ? ?- amLODipine (NORVASC) 10 MG tablet; Take 1 tablet (10 mg total) by mouth daily.  Dispense: 30 tablet; Refill: 0 ?- losartan (COZAAR) 100 MG tablet; Take 1 tablet (100 mg total) by mouth daily.  Dispense: 30 tablet; Refill: 0 ? ?5. Anxiety ? ?- busPIRone (BUSPAR) 15 MG tablet; Take 1 tablet (15 mg total) by mouth 2 (two) times daily. Must make appt for more refills.  Dispense: 60 tablet; Refill: 0 ?- hydrOXYzine (ATARAX) 25 MG tablet; Take 1 tablet (25 mg total) by mouth 3 (three) times daily as needed.  Dispense: 90 tablet; Refill: 0 ? ?6. Reactive depression ? ?- hydrOXYzine (ATARAX) 25 MG tablet; Take 1 tablet (25 mg total) by mouth 3 (three) times daily as needed.  Dispense: 90 tablet; Refill: 0 ?- DULoxetine (CYMBALTA) 30 MG capsule; Take 3 capsules (90 mg total) by mouth daily.  Dispense: 90 capsule; Refill: 0 ? ?7. Routine health maintenance ? ?- CBC ?- Comprehensive metabolic panel ?- Lipid Panel ?- TSH ?- DULoxetine (CYMBALTA) 30 MG capsule; Take 3 capsules (90 mg total) by mouth daily.  Dispense: 90 capsule; Refill: 0 ?- Vitamin D, 25-hydroxy ? ?Follow up: ? ?Follow up in 6 months or sooner if needed ?

## 2022-01-25 NOTE — Patient Instructions (Signed)
1. Vitamin D deficiency ? ?- Vitamin D, 25-hydroxy ? ?2. Iron deficiency ? ?- ferrous fumarate (HEMOCYTE - 106 MG FE) 325 (106 Fe) MG TABS tablet; Take 1 tablet (106 mg of iron total) by mouth daily.  Dispense: 30 tablet; Refill: 0 ? ?3. Encounter for surveillance of contraceptive pills ? ?- norethindrone-ethinyl estradiol-iron (MICROGESTIN FE 1.5/30) 1.5-30 MG-MCG tablet; Take 1 tablet by mouth daily.  Dispense: 30 tablet; Refill: 0 ? ?4. Essential hypertension ? ?- amLODipine (NORVASC) 10 MG tablet; Take 1 tablet (10 mg total) by mouth daily.  Dispense: 30 tablet; Refill: 0 ?- losartan (COZAAR) 100 MG tablet; Take 1 tablet (100 mg total) by mouth daily.  Dispense: 30 tablet; Refill: 0 ? ?5. Anxiety ? ?- busPIRone (BUSPAR) 15 MG tablet; Take 1 tablet (15 mg total) by mouth 2 (two) times daily. Must make appt for more refills.  Dispense: 60 tablet; Refill: 0 ?- hydrOXYzine (ATARAX) 25 MG tablet; Take 1 tablet (25 mg total) by mouth 3 (three) times daily as needed.  Dispense: 90 tablet; Refill: 0 ? ?6. Reactive depression ? ?- hydrOXYzine (ATARAX) 25 MG tablet; Take 1 tablet (25 mg total) by mouth 3 (three) times daily as needed.  Dispense: 90 tablet; Refill: 0 ?- DULoxetine (CYMBALTA) 30 MG capsule; Take 3 capsules (90 mg total) by mouth daily.  Dispense: 90 capsule; Refill: 0 ? ?7. Routine health maintenance ? ?- CBC ?- Comprehensive metabolic panel ?- Lipid Panel ?- TSH ?- DULoxetine (CYMBALTA) 30 MG capsule; Take 3 capsules (90 mg total) by mouth daily.  Dispense: 90 capsule; Refill: 0 ?- Vitamin D, 25-hydroxy ? ?Follow up: ? ?Follow up in 6 months or sooner if needed ? ?Generalized Anxiety Disorder, Adult ?Generalized anxiety disorder (GAD) is a mental health condition. Unlike normal worries, anxiety related to GAD is not triggered by a specific event. These worries do not fade or get better with time. GAD interferes with relationships, work, and school. ?GAD symptoms can vary from mild to severe. People with  severe GAD can have intense waves of anxiety with physical symptoms that are similar to panic attacks. ?What are the causes? ?The exact cause of GAD is not known, but the following are believed to have an impact: ?Differences in natural brain chemicals. ?Genes passed down from parents to children. ?Differences in the way threats are perceived. ?Development and stress during childhood. ?Personality. ?What increases the risk? ?The following factors may make you more likely to develop this condition: ?Being female. ?Having a family history of anxiety disorders. ?Being very shy. ?Experiencing very stressful life events, such as the death of a loved one. ?Having a very stressful family environment. ?What are the signs or symptoms? ?People with GAD often worry excessively about many things in their lives, such as their health and family. Symptoms may also include: ?Mental and emotional symptoms: ?Worrying excessively about natural disasters. ?Fear of being late. ?Difficulty concentrating. ?Fears that others are judging your performance. ?Physical symptoms: ?Fatigue. ?Headaches, muscle tension, muscle twitches, trembling, or feeling shaky. ?Feeling like your heart is pounding or beating very fast. ?Feeling out of breath or like you cannot take a deep breath. ?Having trouble falling asleep or staying asleep, or experiencing restlessness. ?Sweating. ?Nausea, diarrhea, or irritable bowel syndrome (IBS). ?Behavioral symptoms: ?Experiencing erratic moods or irritability. ?Avoidance of new situations. ?Avoidance of people. ?Extreme difficulty making decisions. ?How is this diagnosed? ?This condition is diagnosed based on your symptoms and medical history. You will also have a physical exam. Your health care  provider may perform tests to rule out other possible causes of your symptoms. ?To be diagnosed with GAD, a person must have anxiety that: ?Is out of his or her control. ?Affects several different aspects of his or her life,  such as work and relationships. ?Causes distress that makes him or her unable to take part in normal activities. ?Includes at least three symptoms of GAD, such as restlessness, fatigue, trouble concentrating, irritability, muscle tension, or sleep problems. ?Before your health care provider can confirm a diagnosis of GAD, these symptoms must be present more days than they are not, and they must last for 6 months or longer. ?How is this treated? ?This condition may be treated with: ?Medicine. Antidepressant medicine is usually prescribed for long-term daily control. Anti-anxiety medicines may be added in severe cases, especially when panic attacks occur. ?Talk therapy (psychotherapy). Certain types of talk therapy can be helpful in treating GAD by providing support, education, and guidance. Options include: ?Cognitive behavioral therapy (CBT). People learn coping skills and self-calming techniques to ease their physical symptoms. They learn to identify unrealistic thoughts and behaviors and to replace them with more appropriate thoughts and behaviors. ?Acceptance and commitment therapy (ACT). This treatment teaches people how to be mindful as a way to cope with unwanted thoughts and feelings. ?Biofeedback. This process trains you to manage your body's response (physiological response) through breathing techniques and relaxation methods. You will work with a therapist while machines are used to monitor your physical symptoms. ?Stress management techniques. These include yoga, meditation, and exercise. ?A mental health specialist can help determine which treatment is best for you. Some people see improvement with one type of therapy. However, other people require a combination of therapies. ?Follow these instructions at home: ?Lifestyle ?Maintain a consistent routine and schedule. ?Anticipate stressful situations. Create a plan and allow extra time to work with your plan. ?Practice stress management or self-calming  techniques that you have learned from your therapist or your health care provider. ?Exercise regularly and spend time outdoors. ?Eat a healthy diet that includes plenty of vegetables, fruits, whole grains, low-fat dairy products, and lean protein. ?Do not eat a lot of foods that are high in fat, added sugar, or salt (sodium). ?Drink plenty of water. ?Avoid alcohol. Alcohol can increase anxiety. ?Avoid caffeine and certain over-the-counter cold medicines. These may make you feel worse. Ask your pharmacist which medicines to avoid. ?General instructions ?Take over-the-counter and prescription medicines only as told by your health care provider. ?Understand that you are likely to have setbacks. Accept this and be kind to yourself as you persist to take better care of yourself. ?Anticipate stressful situations. Create a plan and allow extra time to work with your plan. ?Recognize and accept your accomplishments, even if you judge them as small. ?Spend time with people who care about you. ?Keep all follow-up visits. This is important. ?Where to find more information ?[employer] [facility] Mental [facility]: https://carter.com/ ?Substance Abuse and Mental Health Services: ktimeonline.com ?Contact a health care provider if: ?Your symptoms do not get better. ?Your symptoms get worse. ?You have signs of depression, such as: ?A persistently sad or irritable mood. ?Loss of enjoyment in activities that used to bring you joy. ?Change in weight or eating. ?Changes in sleeping habits. ?Get help right away if: ?You have thoughts about hurting yourself or others. ?If you ever feel like you may hurt yourself or others, or have thoughts about taking your own life, get help right away. Go to your nearest emergency  department or: ?Call your local emergency services (911 in the [place] ?Call a suicide crisis helpline, such as the [facility] at 919-163-5852 or 988 in the [place]. This is open 24 hours a day in the  [place] ?Text the Crisis Text Line at 417-411-9644 (in the [place].). ?Summary ?Generalized anxiety disorder (GAD) is a mental health condition that involves worry that is not triggered by a specific event. ?People with GAD oft

## 2022-01-25 NOTE — Progress Notes (Signed)
$'@Patient'Z$  ID: Victoria Curry, female    DOB: July 22, 1966, 56 y.o.   MRN: [id number] ? ?Chief Complaint  ?Patient presents with  ? Follow-up  ?  Pt is here for follow up visit to get medication renewed.  ? ? ?Referring provider: ?Azzie Glatter, FNP ? ? ?HPI ? ?Patient presents today for follow-up visit.  She does need medications refilled today.  Patient states that she has been having a hard time lately.  She no longer has health insurance or housing.  We are getting her set up with counselor today for resources.  Patient does have 5 children.  We will refill her medications for her today.  Overall patient states that she has been doing well other than depression and anxiety related to current social situation. Denies f/c/s, n/v/d, hemoptysis, PND, chest pain or edema. ? ? ? ? ?Allergies  ?Allergen Reactions  ? Pineapple Swelling  ? Adhesive [Tape] Other (See Comments)  ?  Skin irritation   ? Codeine Itching  ? Latex Other (See Comments)  ?  Skin irritation ?  ? Shellfish Allergy   ? ? ?Immunization History  ?Administered Date(s) Administered  ? Hepatitis B, adult 08/10/2017, 09/11/2017  ? Influenza,inj,Quad PF,6+ Mos 11/11/2015, 08/11/2016, 08/10/2017, 07/02/2019  ? PFIZER(Purple Top)SARS-COV-2 Vaccination 12/29/2019, 02/25/2020  ? Tdap 08/11/2016  ? ? ?Past Medical History:  ?Diagnosis Date  ? Depression   ? Fibromyalgia   ? GERD (gastroesophageal reflux disease)   ? Hypertension   ? Vitamin D deficiency 12/2020  ? ? ?Tobacco History: ?Social History  ? ?Tobacco Use  ?Smoking Status Former  ?Smokeless Tobacco Never  ? ?Counseling given: Not Answered ? ? ?Outpatient Encounter Medications as of 01/25/2022  ?Medication Sig  ? acetaminophen (TYLENOL) 650 MG CR tablet Take 650 mg by mouth every 8 (eight) hours as needed for pain.  ? ibuprofen (ADVIL,MOTRIN) 600 MG tablet Take 1 tablet (600 mg total) by mouth every 8 (eight) hours as needed.  ? Vitamin D, Ergocalciferol, (DRISDOL) 1.25 MG (50000 UNIT) CAPS capsule Take 1  capsule (50,000 Units total) by mouth every 7 (seven) days.  ? Vitamin D, Ergocalciferol, (DRISDOL) 1.25 MG (50000 UNIT) CAPS capsule Take 1 capsule (50,000 Units total) by mouth every 7 (seven) days.  ? [DISCONTINUED] amLODipine (NORVASC) 10 MG tablet Take 1 tablet (10 mg total) by mouth daily.  ? [DISCONTINUED] busPIRone (BUSPAR) 15 MG tablet Take 1 tablet (15 mg total) by mouth 2 (two) times daily. Must make appt for more refills.  ? [DISCONTINUED] DULoxetine (CYMBALTA) 30 MG capsule Take 3 capsules (90 mg total) by mouth daily.  ? [DISCONTINUED] ferrous fumarate (HEMOCYTE - 106 MG FE) 325 (106 Fe) MG TABS tablet Take 1 tablet (106 mg of iron total) by mouth daily.  ? [DISCONTINUED] hydrOXYzine (ATARAX) 25 MG tablet Take 1 tablet (25 mg total) by mouth 3 (three) times daily as needed.  ? [DISCONTINUED] losartan (COZAAR) 100 MG tablet Take 1 tablet (100 mg total) by mouth daily.  ? [DISCONTINUED] norethindrone-ethinyl estradiol-iron (MICROGESTIN FE 1.5/30) 1.5-30 MG-MCG tablet Take 1 tablet by mouth daily.  ? amLODipine (NORVASC) 10 MG tablet Take 1 tablet (10 mg total) by mouth daily.  ? busPIRone (BUSPAR) 15 MG tablet Take 1 tablet (15 mg total) by mouth 2 (two) times daily. Must make appt for more refills.  ? DULoxetine (CYMBALTA) 30 MG capsule Take 3 capsules (90 mg total) by mouth daily.  ? ferrous fumarate (HEMOCYTE - 106 MG FE) 325 (106 Fe) MG  TABS tablet Take 1 tablet (106 mg of iron total) by mouth daily.  ? hydrOXYzine (ATARAX) 25 MG tablet Take 1 tablet (25 mg total) by mouth 3 (three) times daily as needed.  ? losartan (COZAAR) 100 MG tablet Take 1 tablet (100 mg total) by mouth daily.  ? norethindrone-ethinyl estradiol-iron (MICROGESTIN FE 1.5/30) 1.5-30 MG-MCG tablet Take 1 tablet by mouth daily.  ? ?No facility-administered encounter medications on file as of 01/25/2022.  ? ? ? ?Review of Systems ? ?Review of Systems  ?Constitutional: Negative.   ?HENT: Negative.    ?Cardiovascular: Negative.    ?Gastrointestinal: Negative.   ?Allergic/Immunologic: Negative.   ?Neurological: Negative.   ?Psychiatric/Behavioral: Negative.     ? ? ? ?Physical Exam ? ?BP (!) 123/93 (BP Location: Left Arm, Cuff Size: Large)   Pulse 80   Temp 98.1 ?F (36.7 ?C)   Ht '5\' 5"'$  (1.651 m)   Wt 208 lb 3.2 oz (94.4 kg)   SpO2 100%   BMI 34.65 kg/m?  ? ?Wt Readings from Last 5 Encounters:  ?01/25/22 208 lb 3.2 oz (94.4 kg)  ?12/29/20 218 lb (98.9 kg)  ?12/24/20 219 lb (99.3 kg)  ?07/02/19 211 lb (95.7 kg)  ?06/21/18 212 lb (96.2 kg)  ? ? ? ?Physical Exam ?Vitals and nursing note reviewed.  ?Constitutional:   ?   General: She is not in acute distress. ?   Appearance: She is well-developed.  ?Cardiovascular:  ?   Rate and Rhythm: Normal rate and regular rhythm.  ?Pulmonary:  ?   Effort: Pulmonary effort is normal.  ?   Breath sounds: Normal breath sounds.  ?Neurological:  ?   Mental Status: She is alert and oriented to person, place, and time.  ? ? ? ? ? ?Assessment & Plan:  ? ?Vitamin D deficiency ?- Vitamin D, 25-hydroxy ? ?2. Iron deficiency ? ?- ferrous fumarate (HEMOCYTE - 106 MG FE) 325 (106 Fe) MG TABS tablet; Take 1 tablet (106 mg of iron total) by mouth daily.  Dispense: 30 tablet; Refill: 0 ? ?3. Encounter for surveillance of contraceptive pills ? ?- norethindrone-ethinyl estradiol-iron (MICROGESTIN FE 1.5/30) 1.5-30 MG-MCG tablet; Take 1 tablet by mouth daily.  Dispense: 30 tablet; Refill: 0 ? ?4. Essential hypertension ? ?- amLODipine (NORVASC) 10 MG tablet; Take 1 tablet (10 mg total) by mouth daily.  Dispense: 30 tablet; Refill: 0 ?- losartan (COZAAR) 100 MG tablet; Take 1 tablet (100 mg total) by mouth daily.  Dispense: 30 tablet; Refill: 0 ? ?5. Anxiety ? ?- busPIRone (BUSPAR) 15 MG tablet; Take 1 tablet (15 mg total) by mouth 2 (two) times daily. Must make appt for more refills.  Dispense: 60 tablet; Refill: 0 ?- hydrOXYzine (ATARAX) 25 MG tablet; Take 1 tablet (25 mg total) by mouth 3 (three) times daily as needed.   Dispense: 90 tablet; Refill: 0 ? ?6. Reactive depression ? ?- hydrOXYzine (ATARAX) 25 MG tablet; Take 1 tablet (25 mg total) by mouth 3 (three) times daily as needed.  Dispense: 90 tablet; Refill: 0 ?- DULoxetine (CYMBALTA) 30 MG capsule; Take 3 capsules (90 mg total) by mouth daily.  Dispense: 90 capsule; Refill: 0 ? ?7. Routine health maintenance ? ?- CBC ?- Comprehensive metabolic panel ?- Lipid Panel ?- TSH ?- DULoxetine (CYMBALTA) 30 MG capsule; Take 3 capsules (90 mg total) by mouth daily.  Dispense: 90 capsule; Refill: 0 ?- Vitamin D, 25-hydroxy ? ?Follow up: ? ?Follow up in 6 months or sooner if needed ? ? ?Patient Instructions  ?  1. Vitamin D deficiency ? ?- Vitamin D, 25-hydroxy ? ?2. Iron deficiency ? ?- ferrous fumarate (HEMOCYTE - 106 MG FE) 325 (106 Fe) MG TABS tablet; Take 1 tablet (106 mg of iron total) by mouth daily.  Dispense: 30 tablet; Refill: 0 ? ?3. Encounter for surveillance of contraceptive pills ? ?- norethindrone-ethinyl estradiol-iron (MICROGESTIN FE 1.5/30) 1.5-30 MG-MCG tablet; Take 1 tablet by mouth daily.  Dispense: 30 tablet; Refill: 0 ? ?4. Essential hypertension ? ?- amLODipine (NORVASC) 10 MG tablet; Take 1 tablet (10 mg total) by mouth daily.  Dispense: 30 tablet; Refill: 0 ?- losartan (COZAAR) 100 MG tablet; Take 1 tablet (100 mg total) by mouth daily.  Dispense: 30 tablet; Refill: 0 ? ?5. Anxiety ? ?- busPIRone (BUSPAR) 15 MG tablet; Take 1 tablet (15 mg total) by mouth 2 (two) times daily. Must make appt for more refills.  Dispense: 60 tablet; Refill: 0 ?- hydrOXYzine (ATARAX) 25 MG tablet; Take 1 tablet (25 mg total) by mouth 3 (three) times daily as needed.  Dispense: 90 tablet; Refill: 0 ? ?6. Reactive depression ? ?- hydrOXYzine (ATARAX) 25 MG tablet; Take 1 tablet (25 mg total) by mouth 3 (three) times daily as needed.  Dispense: 90 tablet; Refill: 0 ?- DULoxetine (CYMBALTA) 30 MG capsule; Take 3 capsules (90 mg total) by mouth daily.  Dispense: 90 capsule; Refill: 0 ? ?7.  Routine health maintenance ? ?- CBC ?- Comprehensive metabolic panel ?- Lipid Panel ?- TSH ?- DULoxetine (CYMBALTA) 30 MG capsule; Take 3 capsules (90 mg total) by mouth daily.  Dispense: 90 capsule; Refill: 0 ?

## 2022-01-26 LAB — COMPREHENSIVE METABOLIC PANEL
ALT: 9 IU/L (ref 0–32)
AST: 12 IU/L (ref 0–40)
Albumin/Globulin Ratio: 1.3 (ref 1.2–2.2)
Albumin: 3.9 g/dL (ref 3.8–4.9)
Alkaline Phosphatase: 78 IU/L (ref 44–121)
BUN/Creatinine Ratio: 9 (ref 9–23)
BUN: 10 mg/dL (ref 6–24)
Bilirubin Total: 0.3 mg/dL (ref 0.0–1.2)
CO2: 20 mmol/L (ref 20–29)
Calcium: 9.4 mg/dL (ref 8.7–10.2)
Chloride: 102 mmol/L (ref 96–106)
Creatinine, Ser: 1.12 mg/dL — ABNORMAL HIGH (ref 0.57–1.00)
Globulin, Total: 3 g/dL (ref 1.5–4.5)
Glucose: 69 mg/dL — ABNORMAL LOW (ref 70–99)
Potassium: 4.5 mmol/L (ref 3.5–5.2)
Sodium: 141 mmol/L (ref 134–144)
Total Protein: 6.9 g/dL (ref 6.0–8.5)
eGFR: 58 mL/min/{1.73_m2} — ABNORMAL LOW (ref >59)

## 2022-01-26 LAB — LIPID PANEL
Chol/HDL Ratio: 4 ratio (ref 0.0–4.4)
Cholesterol, Total: 174 mg/dL (ref 100–199)
HDL: 43 mg/dL (ref >39)
LDL Chol Calc (NIH): 110 mg/dL — ABNORMAL HIGH (ref 0–99)
Triglycerides: 119 mg/dL (ref 0–149)
VLDL Cholesterol Cal: 21 mg/dL (ref 5–40)

## 2022-01-26 LAB — VITAMIN D 25 HYDROXY (VIT D DEFICIENCY, FRACTURES): Vit D, 25-Hydroxy: 36.5 ng/mL (ref 30.0–100.0)

## 2022-01-26 LAB — CBC
Hematocrit: 41.9 % (ref 34.0–46.6)
Hemoglobin: 13.5 g/dL (ref 11.1–15.9)
MCH: 30.9 pg (ref 26.6–33.0)
MCHC: 32.2 g/dL (ref 31.5–35.7)
MCV: 96 fL (ref 79–97)
Platelets: 352 10*3/uL (ref 150–450)
RBC: 4.37 x10E6/uL (ref 3.77–5.28)
RDW: 12.1 % (ref 11.7–15.4)
WBC: 7.7 10*3/uL (ref 3.4–10.8)

## 2022-01-26 LAB — TSH: TSH: 2.01 u[IU]/mL (ref 0.450–4.500)

## 2022-03-04 ENCOUNTER — Other Ambulatory Visit: Payer: Self-pay | Admitting: Nurse Practitioner

## 2022-03-04 DIAGNOSIS — F419 Anxiety disorder, unspecified: Secondary | ICD-10-CM

## 2022-06-12 ENCOUNTER — Other Ambulatory Visit: Payer: Self-pay | Admitting: Nurse Practitioner

## 2022-06-12 DIAGNOSIS — Z0000 Encounter for general adult medical examination without abnormal findings: Secondary | ICD-10-CM

## 2022-06-12 DIAGNOSIS — F329 Major depressive disorder, single episode, unspecified: Secondary | ICD-10-CM

## 2022-06-20 ENCOUNTER — Other Ambulatory Visit: Payer: Self-pay

## 2022-06-20 DIAGNOSIS — Z0000 Encounter for general adult medical examination without abnormal findings: Secondary | ICD-10-CM

## 2022-06-20 DIAGNOSIS — F329 Major depressive disorder, single episode, unspecified: Secondary | ICD-10-CM

## 2022-06-20 DIAGNOSIS — F419 Anxiety disorder, unspecified: Secondary | ICD-10-CM

## 2022-06-21 MED ORDER — BUSPIRONE HCL 15 MG PO TABS: 15.0000 mg | ORAL_TABLET | Freq: Two times a day (BID) | ORAL | 0 refills | Status: DC

## 2022-06-21 MED ORDER — DULOXETINE HCL 30 MG PO CPEP: 90.0000 mg | ORAL_CAPSULE | Freq: Every day | ORAL | 0 refills | Status: DC

## 2022-06-30 ENCOUNTER — Other Ambulatory Visit: Payer: Self-pay

## 2022-06-30 DIAGNOSIS — Z0000 Encounter for general adult medical examination without abnormal findings: Secondary | ICD-10-CM

## 2022-06-30 DIAGNOSIS — F329 Major depressive disorder, single episode, unspecified: Secondary | ICD-10-CM

## 2022-06-30 MED ORDER — DULOXETINE HCL 30 MG PO CPEP: 90.0000 mg | ORAL_CAPSULE | Freq: Every day | ORAL | 1 refills | Status: DC

## 2022-07-27 ENCOUNTER — Other Ambulatory Visit: Payer: Self-pay

## 2022-07-27 DIAGNOSIS — F419 Anxiety disorder, unspecified: Secondary | ICD-10-CM

## 2022-07-28 MED ORDER — BUSPIRONE HCL 15 MG PO TABS: 15.0000 mg | ORAL_TABLET | Freq: Two times a day (BID) | ORAL | 0 refills | Status: DC

## 2022-10-06 ENCOUNTER — Other Ambulatory Visit: Payer: Self-pay | Admitting: Nurse Practitioner

## 2022-10-06 DIAGNOSIS — I10 Essential (primary) hypertension: Secondary | ICD-10-CM

## 2022-10-17 ENCOUNTER — Telehealth: Payer: Self-pay | Admitting: Nurse Practitioner

## 2022-10-17 NOTE — Telephone Encounter (Signed)
Caller & Relationship to patient: Pharmacy request   MRN #  [id number]   Call Back Number: [phone]  Date of Last Office Visit: 10/06/2022     Date of Next Office Visit: Visit date not found    Medication(s) to be Refilled: Buspar '15mg'$   Preferred Pharmacy: [facility] on [address]   ** Please notify patient to allow 48-72 hours to process** **Let patient know to contact pharmacy at the end of the day to make sure medication is ready. ** **If patient has not been seen in a year or longer, book an appointment **Advise to use MyChart for refill requests OR to contact their pharmacy

## 2022-10-17 NOTE — Telephone Encounter (Signed)
Caller & Relationship to patient:  [place] MRN #  [id number]   Call Back Number: [phone]  Date of Last Office Visit: 10/17/2022     Date of Next Office Visit: Visit date not found    Medication(s) to be Refilled: Losartan  Preferred Pharmacy: [facility]  ** Please notify patient to allow 48-72 hours to process** **Let patient know to contact pharmacy at the end of the day to make sure medication is ready. ** **If patient has not been seen in a year or longer, book an appointment **Advise to use MyChart for refill requests OR to contact their pharmacy

## 2022-10-20 ENCOUNTER — Other Ambulatory Visit: Payer: Self-pay | Admitting: *Deleted

## 2022-10-20 ENCOUNTER — Other Ambulatory Visit: Payer: Self-pay | Admitting: Nurse Practitioner

## 2022-10-20 DIAGNOSIS — F419 Anxiety disorder, unspecified: Secondary | ICD-10-CM

## 2022-10-20 DIAGNOSIS — I10 Essential (primary) hypertension: Secondary | ICD-10-CM

## 2022-10-20 MED ORDER — BUSPIRONE HCL 15 MG PO TABS: 15.0000 mg | ORAL_TABLET | Freq: Two times a day (BID) | ORAL | 0 refills | Status: DC

## 2022-10-20 MED ORDER — AMLODIPINE BESYLATE 10 MG PO TABS: 10.0000 mg | ORAL_TABLET | Freq: Every day | ORAL | 0 refills | Status: DC

## 2022-10-20 MED ORDER — LOSARTAN POTASSIUM 100 MG PO TABS: 100.0000 mg | ORAL_TABLET | Freq: Every day | ORAL | 0 refills | Status: DC

## 2022-10-30 ENCOUNTER — Other Ambulatory Visit: Payer: Self-pay

## 2022-10-30 DIAGNOSIS — F419 Anxiety disorder, unspecified: Secondary | ICD-10-CM

## 2022-10-30 DIAGNOSIS — I10 Essential (primary) hypertension: Secondary | ICD-10-CM

## 2022-10-30 MED ORDER — BUSPIRONE HCL 15 MG PO TABS: 15.0000 mg | ORAL_TABLET | Freq: Two times a day (BID) | ORAL | 0 refills | Status: DC

## 2022-10-30 MED ORDER — AMLODIPINE BESYLATE 10 MG PO TABS: 10.0000 mg | ORAL_TABLET | Freq: Every day | ORAL | 0 refills | Status: DC

## 2022-10-30 MED ORDER — LOSARTAN POTASSIUM 100 MG PO TABS: 100.0000 mg | ORAL_TABLET | Freq: Every day | ORAL | 0 refills | Status: DC

## 2022-10-30 NOTE — Telephone Encounter (Signed)
.  Caller & Relationship to patient:  MRN #  [id number]   Call Back Number:   Date of Last Office Visit: 10/20/2022     Date of Next Office Visit: Visit date not found    Medication(s) to be Refilled: buspar  Preferred Pharmacy:  [facility] [place]  ** Please notify patient to allow 48-72 hours to process** **Let patient know to contact pharmacy at the end of the day to make sure medication is ready. ** **If patient has not been seen in a year or longer, book an appointment **Advise to use MyChart for refill requests OR to contact their pharmacy

## 2022-11-15 ENCOUNTER — Other Ambulatory Visit (HOSPITAL_COMMUNITY)
Admission: RE | Admit: 2022-11-15 | Discharge: 2022-11-15 | Disposition: A | Discharge status: Home / Self Care | Payer: BC Managed Care – PPO | Source: Ambulatory Visit | Attending: Nurse Practitioner | Admitting: Nurse Practitioner

## 2022-11-15 ENCOUNTER — Ambulatory Visit: Payer: BC Managed Care – PPO | Admitting: Nurse Practitioner

## 2022-11-15 ENCOUNTER — Encounter: Payer: Self-pay | Admitting: Nurse Practitioner

## 2022-11-15 VITALS — BP 127/92 | HR 78 | Temp 97.3°F | Ht 64.5 in | Wt 201.2 lb

## 2022-11-15 DIAGNOSIS — Z1211 Encounter for screening for malignant neoplasm of colon: Secondary | ICD-10-CM

## 2022-11-15 DIAGNOSIS — E611 Iron deficiency: Secondary | ICD-10-CM | POA: Diagnosis not present

## 2022-11-15 DIAGNOSIS — Z124 Encounter for screening for malignant neoplasm of cervix: Secondary | ICD-10-CM

## 2022-11-15 DIAGNOSIS — G8929 Other chronic pain: Secondary | ICD-10-CM

## 2022-11-15 DIAGNOSIS — Z3041 Encounter for surveillance of contraceptive pills: Secondary | ICD-10-CM

## 2022-11-15 DIAGNOSIS — Z1283 Encounter for screening for malignant neoplasm of skin: Secondary | ICD-10-CM

## 2022-11-15 DIAGNOSIS — M545 Low back pain, unspecified: Secondary | ICD-10-CM

## 2022-11-15 DIAGNOSIS — E559 Vitamin D deficiency, unspecified: Secondary | ICD-10-CM

## 2022-11-15 DIAGNOSIS — M2550 Pain in unspecified joint: Secondary | ICD-10-CM

## 2022-11-15 DIAGNOSIS — R413 Other amnesia: Secondary | ICD-10-CM

## 2022-11-15 DIAGNOSIS — F329 Major depressive disorder, single episode, unspecified: Secondary | ICD-10-CM

## 2022-11-15 DIAGNOSIS — Z0000 Encounter for general adult medical examination without abnormal findings: Secondary | ICD-10-CM

## 2022-11-15 DIAGNOSIS — G5603 Carpal tunnel syndrome, bilateral upper limbs: Secondary | ICD-10-CM

## 2022-11-15 DIAGNOSIS — Z1329 Encounter for screening for other suspected endocrine disorder: Secondary | ICD-10-CM

## 2022-11-15 DIAGNOSIS — Z13220 Encounter for screening for lipoid disorders: Secondary | ICD-10-CM

## 2022-11-15 DIAGNOSIS — F32A Depression, unspecified: Secondary | ICD-10-CM

## 2022-11-15 DIAGNOSIS — I10 Essential (primary) hypertension: Secondary | ICD-10-CM

## 2022-11-15 DIAGNOSIS — Z1231 Encounter for screening mammogram for malignant neoplasm of breast: Secondary | ICD-10-CM

## 2022-11-15 DIAGNOSIS — F419 Anxiety disorder, unspecified: Secondary | ICD-10-CM

## 2022-11-15 MED ORDER — BUSPIRONE HCL 15 MG PO TABS: 15.0000 mg | ORAL_TABLET | Freq: Two times a day (BID) | ORAL | 2 refills | Status: DC

## 2022-11-15 MED ORDER — LOSARTAN POTASSIUM 100 MG PO TABS: 100.0000 mg | ORAL_TABLET | Freq: Every day | ORAL | 2 refills | Status: DC

## 2022-11-15 MED ORDER — FERROUS FUMARATE 325 (106 FE) MG PO TABS: 1.0000 | ORAL_TABLET | Freq: Every day | ORAL | 2 refills | Status: DC

## 2022-11-15 MED ORDER — AMLODIPINE BESYLATE 10 MG PO TABS: 10.0000 mg | ORAL_TABLET | Freq: Every day | ORAL | 2 refills | Status: DC

## 2022-11-15 MED ORDER — NORETHIN ACE-ETH ESTRAD-FE 1.5-30 MG-MCG PO TABS: 1.0000 | ORAL_TABLET | Freq: Every day | ORAL | 2 refills | Status: DC

## 2022-11-15 MED ORDER — VITAMIN D (ERGOCALCIFEROL) 1.25 MG (50000 UNIT) PO CAPS: 50000.0000 [IU] | ORAL_CAPSULE | ORAL | 0 refills | Status: DC

## 2022-11-15 MED ORDER — DULOXETINE HCL 30 MG PO CPEP: 90.0000 mg | ORAL_CAPSULE | Freq: Every day | ORAL | 1 refills | Status: DC

## 2022-11-15 NOTE — Patient Instructions (Signed)
1. Iron deficiency  - ferrous fumarate (HEMOCYTE - 106 MG FE) 325 (106 Fe) MG TABS tablet; Take 1 tablet (106 mg of iron total) by mouth daily.  Dispense: 30 tablet; Refill: 2  2. Vitamin D deficiency  - Vitamin D, Ergocalciferol, (DRISDOL) 1.25 MG (50000 UNIT) CAPS capsule; Take 1 capsule (50,000 Units total) by mouth every 7 (seven) days.  Dispense: 5 capsule; Refill: 0  3. Encounter for surveillance of contraceptive pills  - norethindrone-ethinyl estradiol-iron (MICROGESTIN FE 1.5/30) 1.5-30 MG-MCG tablet; Take 1 tablet by mouth daily.  Dispense: 30 tablet; Refill: 2  4. Memory loss  - Ambulatory referral to Neurology  5. Bilateral carpal tunnel syndrome  - Ambulatory referral to Neurology  6. Skin cancer screening  - Ambulatory referral to Dermatology  7. Encounter for screening mammogram for malignant neoplasm of breast  - MM Digital Screening; Future  8. Colon cancer screening  - Ambulatory referral to Gastroenterology  9. Cervical cancer screening  - Cytology - PAP([place])  10. Depression, unspecified depression type  - Ambulatory referral to Psychiatry  11. Lipid screening  - Lipid Panel  12. Multiple joint pain  - ANA - Rheumatoid factor  13. Thyroid disorder screen  - Thyroid Panel With TSH  14. Essential hypertension  - CBC - Comprehensive metabolic panel  Follow up:  Follow up in 6 months

## 2022-11-15 NOTE — Progress Notes (Unsigned)
$'@Patient'd$  ID: Victoria Curry, female    DOB: 1966-04-11, 57 y.o.   MRN: [id number]  Chief Complaint  Patient presents with   Annual Exam    Referring provider: Fenton Foy, NP  HPI  Presents today for physical with Pap.  She has a very long list of issues that she would like to address today.  Patient has been having issues with memory loss, mole to left her knee, depression, joint pain.  Patient has been behind on health screening due to being homeless.  She is now living with her son.  We will order labs mammogram and colon cancer screening today. Denies f/c/s, n/v/d, hemoptysis, PND, leg swelling Denies chest pain or edema     Allergies  Allergen Reactions   Pineapple Swelling   Adhesive [Tape] Other (See Comments)    Skin irritation    Codeine Itching   Latex Other (See Comments)    Skin irritation    Shellfish Allergy     Immunization History  Administered Date(s) Administered   Hepatitis B, adult 08/10/2017, 09/11/2017   Influenza,inj,Quad PF,6+ Mos 11/11/2015, 08/11/2016, 08/10/2017, 07/02/2019   PFIZER(Purple Top)SARS-COV-2 Vaccination 12/29/2019, 02/25/2020   Tdap 08/11/2016    Past Medical History:  Diagnosis Date   Depression    Fibromyalgia    GERD (gastroesophageal reflux disease)    Hypertension    Vitamin D deficiency 12/2020    Tobacco History: Social History   Tobacco Use  Smoking Status Former  Smokeless Tobacco Never   Counseling given: Not Answered   Outpatient Encounter Medications as of 11/15/2022  Medication Sig   acetaminophen (TYLENOL) 650 MG CR tablet Take 650 mg by mouth every 8 (eight) hours as needed for pain.   hydrOXYzine (ATARAX) 25 MG tablet Take 1 tablet (25 mg total) by mouth 3 (three) times daily as needed.   ibuprofen (ADVIL,MOTRIN) 600 MG tablet Take 1 tablet (600 mg total) by mouth every 8 (eight) hours as needed.   [DISCONTINUED] amLODipine (NORVASC) 10 MG tablet Take 1 tablet (10 mg total) by mouth daily.    [DISCONTINUED] busPIRone (BUSPAR) 15 MG tablet Take 1 tablet (15 mg total) by mouth 2 (two) times daily. Must make appt for more refills.   [DISCONTINUED] DULoxetine (CYMBALTA) 30 MG capsule Take 3 capsules (90 mg total) by mouth daily.   [DISCONTINUED] losartan (COZAAR) 100 MG tablet Take 1 tablet (100 mg total) by mouth daily.   amLODipine (NORVASC) 10 MG tablet Take 1 tablet (10 mg total) by mouth daily.   busPIRone (BUSPAR) 15 MG tablet Take 1 tablet (15 mg total) by mouth 2 (two) times daily. Must make appt for more refills.   DULoxetine (CYMBALTA) 30 MG capsule Take 3 capsules (90 mg total) by mouth daily.   ferrous fumarate (HEMOCYTE - 106 MG FE) 325 (106 Fe) MG TABS tablet Take 1 tablet (106 mg of iron total) by mouth daily.   losartan (COZAAR) 100 MG tablet Take 1 tablet (100 mg total) by mouth daily.   norethindrone-ethinyl estradiol-iron (MICROGESTIN FE 1.5/30) 1.5-30 MG-MCG tablet Take 1 tablet by mouth daily.   Vitamin D, Ergocalciferol, (DRISDOL) 1.25 MG (50000 UNIT) CAPS capsule Take 1 capsule (50,000 Units total) by mouth every 7 (seven) days.   [DISCONTINUED] ferrous fumarate (HEMOCYTE - 106 MG FE) 325 (106 Fe) MG TABS tablet Take 1 tablet (106 mg of iron total) by mouth daily. (Patient not taking: Reported on 11/15/2022)   [DISCONTINUED] norethindrone-ethinyl estradiol-iron (MICROGESTIN FE 1.5/30) 1.5-30 MG-MCG tablet Take  1 tablet by mouth daily. (Patient not taking: Reported on 11/15/2022)   [DISCONTINUED] Vitamin D, Ergocalciferol, (DRISDOL) 1.25 MG (50000 UNIT) CAPS capsule Take 1 capsule (50,000 Units total) by mouth every 7 (seven) days. (Patient not taking: Reported on 11/15/2022)   No facility-administered encounter medications on file as of 11/15/2022.     Review of Systems  Review of Systems  Constitutional: Negative.   HENT: Negative.    Cardiovascular: Negative.   Gastrointestinal: Negative.   Allergic/Immunologic: Negative.   Neurological: Negative.    Psychiatric/Behavioral: Negative.         Physical Exam  BP (!) 127/92   Pulse 78   Temp (!) 97.3 F (36.3 C) (Temporal)   Ht 5' 4.5" (1.638 m)   Wt 201 lb 3.2 oz (91.3 kg)   SpO2 100%   BMI 34.00 kg/m   Wt Readings from Last 5 Encounters:  11/15/22 201 lb 3.2 oz (91.3 kg)  01/25/22 208 lb 3.2 oz (94.4 kg)  12/29/20 218 lb (98.9 kg)  12/24/20 219 lb (99.3 kg)  07/02/19 211 lb (95.7 kg)     Physical Exam Vitals and nursing note reviewed.  Constitutional:      General: She is not in acute distress.    Appearance: She is well-developed.  Cardiovascular:     Rate and Rhythm: Normal rate and regular rhythm.  Pulmonary:     Effort: Pulmonary effort is normal.     Breath sounds: Normal breath sounds.  Neurological:     Mental Status: She is alert and oriented to person, place, and time.      Lab Results:  CBC    Component Value Date/Time   WBC 8.3 11/15/2022 1609   WBC 7.1 07/13/2017 1143   RBC 4.13 11/15/2022 1609   RBC 3.87 07/13/2017 1143   HGB 12.9 11/15/2022 1609   HCT 38.2 11/15/2022 1609   PLT 240 11/15/2022 1609   MCV 93 11/15/2022 1609   MCH 31.2 11/15/2022 1609   MCH 31.0 07/13/2017 1143   MCHC 33.8 11/15/2022 1609   MCHC 33.1 07/13/2017 1143   RDW 11.9 11/15/2022 1609   LYMPHSABS 2.6 12/24/2020 1055   MONOABS 568 03/05/2017 1422   EOSABS 0.1 12/24/2020 1055   BASOSABS 0.0 12/24/2020 1055    BMET    Component Value Date/Time   NA 141 11/15/2022 1609   K 4.2 11/15/2022 1609   CL 103 11/15/2022 1609   CO2 21 11/15/2022 1609   GLUCOSE 87 11/15/2022 1609   GLUCOSE 90 03/05/2017 1422   BUN 20 11/15/2022 1609   CREATININE 1.21 (H) 11/15/2022 1609   CREATININE 0.99 03/05/2017 1422   CALCIUM 9.3 11/15/2022 1609   GFRNONAA 70 03/04/2018 1607   GFRNONAA 66 03/05/2017 1422   GFRAA 81 03/04/2018 1607   GFRAA 76 03/05/2017 1422      Assessment & Plan:   Iron deficiency - ferrous fumarate (HEMOCYTE - 106 MG FE) 325 (106 Fe) MG TABS  tablet; Take 1 tablet (106 mg of iron total) by mouth daily.  Dispense: 30 tablet; Refill: 2  2. Vitamin D deficiency  - Vitamin D, Ergocalciferol, (DRISDOL) 1.25 MG (50000 UNIT) CAPS capsule; Take 1 capsule (50,000 Units total) by mouth every 7 (seven) days.  Dispense: 5 capsule; Refill: 0  3. Encounter for surveillance of contraceptive pills  - norethindrone-ethinyl estradiol-iron (MICROGESTIN FE 1.5/30) 1.5-30 MG-MCG tablet; Take 1 tablet by mouth daily.  Dispense: 30 tablet; Refill: 2  4. Memory loss  - Ambulatory referral  to Neurology  5. Bilateral carpal tunnel syndrome  - Ambulatory referral to Neurology  6. Skin cancer screening  - Ambulatory referral to Dermatology  7. Encounter for screening mammogram for malignant neoplasm of breast  - MM Digital Screening; Future  8. Colon cancer screening  - Ambulatory referral to Gastroenterology  9. Cervical cancer screening  - Cytology - PAP([place])  10. Depression, unspecified depression type  - Ambulatory referral to Psychiatry  11. Lipid screening  - Lipid Panel  12. Multiple joint pain  - ANA - Rheumatoid factor  13. Thyroid disorder screen  - Thyroid Panel With TSH  14. Essential hypertension  - CBC - Comprehensive metabolic panel  Follow up:  Follow up in 6 months     Fenton Foy, NP 11/16/2022

## 2022-11-16 ENCOUNTER — Other Ambulatory Visit: Payer: Self-pay | Admitting: Nurse Practitioner

## 2022-11-16 ENCOUNTER — Encounter: Payer: Self-pay | Admitting: Nurse Practitioner

## 2022-11-16 DIAGNOSIS — E611 Iron deficiency: Secondary | ICD-10-CM | POA: Insufficient documentation

## 2022-11-16 DIAGNOSIS — R7689 Other specified abnormal immunological findings in serum: Secondary | ICD-10-CM

## 2022-11-16 DIAGNOSIS — N289 Disorder of kidney and ureter, unspecified: Secondary | ICD-10-CM

## 2022-11-16 DIAGNOSIS — R768 Other specified abnormal immunological findings in serum: Secondary | ICD-10-CM

## 2022-11-16 DIAGNOSIS — M2550 Pain in unspecified joint: Secondary | ICD-10-CM

## 2022-11-16 LAB — RHEUMATOID FACTOR: Rheumatoid fact SerPl-aCnc: 19.5 IU/mL — ABNORMAL HIGH (ref <14.0)

## 2022-11-16 NOTE — Assessment & Plan Note (Signed)
-  ferrous fumarate (HEMOCYTE - 106 MG FE) 325 (106 Fe) MG TABS tablet; Take 1 tablet (106 mg of iron total) by mouth daily.  Dispense: 30 tablet; Refill: 2  2. Vitamin D deficiency  - Vitamin D, Ergocalciferol, (DRISDOL) 1.25 MG (50000 UNIT) CAPS capsule; Take 1 capsule (50,000 Units total) by mouth every 7 (seven) days.  Dispense: 5 capsule; Refill: 0  3. Encounter for surveillance of contraceptive pills  - norethindrone-ethinyl estradiol-iron (MICROGESTIN FE 1.5/30) 1.5-30 MG-MCG tablet; Take 1 tablet by mouth daily.  Dispense: 30 tablet; Refill: 2  4. Memory loss  - Ambulatory referral to Neurology  5. Bilateral carpal tunnel syndrome  - Ambulatory referral to Neurology  6. Skin cancer screening  - Ambulatory referral to Dermatology  7. Encounter for screening mammogram for malignant neoplasm of breast  - MM Digital Screening; Future  8. Colon cancer screening  - Ambulatory referral to Gastroenterology  9. Cervical cancer screening  - Cytology - PAP([place])  10. Depression, unspecified depression type  - Ambulatory referral to Psychiatry  11. Lipid screening  - Lipid Panel  12. Multiple joint pain  - ANA - Rheumatoid factor  13. Thyroid disorder screen  - Thyroid Panel With TSH  14. Essential hypertension  - CBC - Comprehensive metabolic panel  Follow up:  Follow up in 6 months

## 2022-11-17 LAB — COMPREHENSIVE METABOLIC PANEL
ALT: 14 IU/L (ref 0–32)
AST: 17 IU/L (ref 0–40)
Albumin/Globulin Ratio: 1.4 (ref 1.2–2.2)
Albumin: 4.1 g/dL (ref 3.8–4.9)
Alkaline Phosphatase: 113 IU/L (ref 44–121)
BUN/Creatinine Ratio: 17 (ref 9–23)
BUN: 20 mg/dL (ref 6–24)
Bilirubin Total: 0.2 mg/dL (ref 0.0–1.2)
CO2: 21 mmol/L (ref 20–29)
Calcium: 9.3 mg/dL (ref 8.7–10.2)
Chloride: 103 mmol/L (ref 96–106)
Creatinine, Ser: 1.21 mg/dL — ABNORMAL HIGH (ref 0.57–1.00)
Globulin, Total: 3 g/dL (ref 1.5–4.5)
Glucose: 87 mg/dL (ref 70–99)
Potassium: 4.2 mmol/L (ref 3.5–5.2)
Sodium: 141 mmol/L (ref 134–144)
Total Protein: 7.1 g/dL (ref 6.0–8.5)
eGFR: 53 mL/min/{1.73_m2} — ABNORMAL LOW (ref >59)

## 2022-11-17 LAB — LIPID PANEL
Chol/HDL Ratio: 3.1 ratio (ref 0.0–4.4)
Cholesterol, Total: 168 mg/dL (ref 100–199)
HDL: 54 mg/dL (ref >39)
LDL Chol Calc (NIH): 99 mg/dL (ref 0–99)
Triglycerides: 82 mg/dL (ref 0–149)
VLDL Cholesterol Cal: 15 mg/dL (ref 5–40)

## 2022-11-17 LAB — CBC
Hematocrit: 38.2 % (ref 34.0–46.6)
Hemoglobin: 12.9 g/dL (ref 11.1–15.9)
MCH: 31.2 pg (ref 26.6–33.0)
MCHC: 33.8 g/dL (ref 31.5–35.7)
MCV: 93 fL (ref 79–97)
Platelets: 240 10*3/uL (ref 150–450)
RBC: 4.13 x10E6/uL (ref 3.77–5.28)
RDW: 11.9 % (ref 11.7–15.4)
WBC: 8.3 10*3/uL (ref 3.4–10.8)

## 2022-11-17 LAB — THYROID PANEL WITH TSH
Free Thyroxine Index: 1.5 (ref 1.2–4.9)
T3 Uptake Ratio: 26 % (ref 24–39)
T4, Total: 5.6 ug/dL (ref 4.5–12.0)
TSH: 1.08 u[IU]/mL (ref 0.450–4.500)

## 2022-11-17 LAB — ANA: Anti Nuclear Antibody (ANA): NEGATIVE

## 2022-11-22 ENCOUNTER — Ambulatory Visit (HOSPITAL_COMMUNITY)
Admission: RE | Admit: 2022-11-22 | Discharge: 2022-11-22 | Disposition: A | Discharge status: Home / Self Care | Payer: BC Managed Care – PPO | Source: Ambulatory Visit | Attending: Nurse Practitioner | Admitting: Nurse Practitioner

## 2022-11-22 DIAGNOSIS — G8929 Other chronic pain: Secondary | ICD-10-CM | POA: Insufficient documentation

## 2022-11-22 DIAGNOSIS — M545 Low back pain, unspecified: Secondary | ICD-10-CM | POA: Insufficient documentation

## 2022-11-23 LAB — CYTOLOGY - PAP: Diagnosis: NEGATIVE

## 2022-12-22 DIAGNOSIS — N189 Chronic kidney disease, unspecified: Secondary | ICD-10-CM

## 2022-12-22 HISTORY — DX: Chronic kidney disease, unspecified: N18.9

## 2022-12-28 ENCOUNTER — Other Ambulatory Visit: Payer: Self-pay

## 2022-12-28 DIAGNOSIS — N939 Abnormal uterine and vaginal bleeding, unspecified: Secondary | ICD-10-CM

## 2022-12-28 NOTE — Telephone Encounter (Signed)
Please place a referral for patient to OB/GYN

## 2023-01-11 ENCOUNTER — Ambulatory Visit: Payer: BC Managed Care – PPO

## 2023-01-11 ENCOUNTER — Encounter (HOSPITAL_COMMUNITY): Payer: Self-pay

## 2023-01-11 ENCOUNTER — Ambulatory Visit (HOSPITAL_COMMUNITY): Payer: Self-pay | Admitting: Clinical

## 2023-01-11 ENCOUNTER — Ambulatory Visit (INDEPENDENT_AMBULATORY_CARE_PROVIDER_SITE_OTHER): Payer: BC Managed Care – PPO

## 2023-01-11 ENCOUNTER — Encounter: Payer: Self-pay | Admitting: Internal Medicine

## 2023-01-11 ENCOUNTER — Ambulatory Visit: Payer: BC Managed Care – PPO | Attending: Internal Medicine | Admitting: Internal Medicine

## 2023-01-11 VITALS — BP 115/75 | HR 71 | Resp 16 | Ht 66.5 in | Wt 202.0 lb

## 2023-01-11 DIAGNOSIS — M797 Fibromyalgia: Secondary | ICD-10-CM | POA: Diagnosis not present

## 2023-01-11 DIAGNOSIS — M7989 Other specified soft tissue disorders: Secondary | ICD-10-CM | POA: Insufficient documentation

## 2023-01-11 DIAGNOSIS — M79641 Pain in right hand: Secondary | ICD-10-CM | POA: Diagnosis not present

## 2023-01-11 DIAGNOSIS — M79642 Pain in left hand: Secondary | ICD-10-CM | POA: Diagnosis not present

## 2023-01-11 DIAGNOSIS — R768 Other specified abnormal immunological findings in serum: Secondary | ICD-10-CM

## 2023-01-11 NOTE — Progress Notes (Signed)
Office Visit Note  Patient: Victoria Curry             Date of Birth: 07-18-1966           MRN: [id number]             PCP: Ivonne Andrew, NP Referring: Ivonne Andrew, NP Visit Date: 01/11/2023 Occupation: School administration  Subjective:  New Patient (Initial Visit) (Patient states her mother had spinal stenosis. Patient state she has pain in lower back and neck. Patient states she has chronic pain.)   History of Present Illness: TERRIANNA HOLSCLAW is a 57 y.o. female here for evaluation of joint pain at multiple areas and positive rheumatoid factor.  She has had pain pretty widespread but probably most consistently present involving the neck and low back.  This has been ongoing for about 25 to 30 years with some chronic degenerative changes identified on previous spine imaging but some pain that has been attributed to more myofascial pain or sensitivity.  She was diagnosed with fibromyalgia syndrome since at least 2016 with chronic pain and sensitivity also complicated by fatigue and concentration and memory difficulty. She takes Cymbalta 90 mg daily and also taking ibuprofen 600 mg and Tylenol 650 mg as needed for joint pains.  However was recently evaluated in PCP office with symptoms including some bilateral hand swelling which was a new issue for her.  She did not recall any preceding injury or medical change leading up to this.  Had not typically been experiencing severe numbness or radicular symptoms from the arthritis in her spine  Lab testing showed a low positive rheumatoid factor of 19.5.  RF 19.5  Activities of Daily Living:  Patient reports morning stiffness for 1 hour.   Patient Reports nocturnal pain.  Difficulty dressing/grooming: Denies Difficulty climbing stairs: Reports Difficulty getting out of chair: Reports Difficulty using hands for taps, buttons, cutlery, and/or writing: Reports  Review of Systems  Constitutional:  Positive for fatigue.  HENT:  Negative  for mouth sores and mouth dryness.   Eyes:  Negative for dryness.  Respiratory:  Negative for shortness of breath.   Cardiovascular:  Negative for chest pain and palpitations.  Gastrointestinal:  Negative for blood in stool, constipation and diarrhea.  Endocrine: Negative for increased urination.  Genitourinary:  Negative for involuntary urination.  Musculoskeletal:  Positive for joint pain, joint pain, joint swelling, myalgias, muscle weakness, morning stiffness, muscle tenderness and myalgias. Negative for gait problem.  Skin:  Negative for color change, rash, hair loss and sensitivity to sunlight.  Allergic/Immunologic: Negative for susceptible to infections.  Neurological:  Negative for dizziness and headaches.  Hematological:  Negative for swollen glands.  Psychiatric/Behavioral:  Positive for depressed mood and sleep disturbance. The patient is nervous/anxious.     PMFS History:  Patient Active Problem List   Diagnosis Date Noted   Rheumatoid factor positive 01/11/2023   Bilateral hand swelling 01/11/2023   Iron deficiency 11/16/2022   Vitamin D deficiency 01/25/2022   Fibroid uterus 08/03/2017   Menorrhagia with regular cycle 08/03/2017   Acute non intractable tension-type headache 12/25/2016   Nausea 12/25/2016   Masses of both breasts 12/29/2015   Absolute anemia 11/13/2015   Essential hypertension 10/09/2015   Gastroesophageal reflux disease without esophagitis 10/09/2015   Metabolic syndrome 10/09/2015   Fibromyalgia 10/09/2015   Depression 10/09/2015    Past Medical History:  Diagnosis Date   Depression    Fibromyalgia    GERD (gastroesophageal reflux disease)  Hypertension    Vitamin D deficiency 12/2020    Family History  Problem Relation Age of Onset   Diabetes Mother    Hypertension Mother    Alzheimer's disease Mother    Cancer Mother    Heart disease Mother    Bursitis Mother    Hypertension Father    Heart disease Father    Cancer Father         testiculiar    Past Surgical History:  Procedure Laterality Date   BREAST LUMPECTOMY     Social History   Social History Narrative   Not on file   Immunization History  Administered Date(s) Administered   Hepatitis B, ADULT 08/10/2017, 09/11/2017   Influenza,inj,Quad PF,6+ Mos 11/11/2015, 08/11/2016, 08/10/2017, 07/02/2019   PFIZER(Purple Top)SARS-COV-2 Vaccination 12/29/2019, 02/25/2020   Tdap 08/11/2016     Objective: Vital Signs: BP 115/75 (BP Location: Right Arm, Patient Position: Sitting, Cuff Size: Normal)   Pulse 71   Resp 16   Ht 5' 6.5" (1.689 m)   Wt 202 lb (91.6 kg)   BMI 32.12 kg/m    Physical Exam Constitutional:      Appearance: She is obese.  Cardiovascular:     Rate and Rhythm: Normal rate and regular rhythm.  Pulmonary:     Effort: Pulmonary effort is normal.     Breath sounds: Normal breath sounds.  Musculoskeletal:     Right lower leg: No edema.     Left lower leg: No edema.  Skin:    General: Skin is warm and dry.     Findings: No rash.  Neurological:     Mental Status: She is alert.  Psychiatric:        Mood and Affect: Mood normal.      Musculoskeletal Exam:  Shoulders full ROM no tenderness or swelling Elbows full ROM no tenderness or swelling Wrists full ROM no tenderness or swelling Fingers full ROM no tenderness or swelling Knees full ROM no tenderness or swelling Ankles full ROM no tenderness or swelling   Investigation: No additional findings.  Imaging: No results found.  Recent Labs: Lab Results  Component Value Date   WBC 8.3 11/15/2022   HGB 12.9 11/15/2022   PLT 240 11/15/2022   NA 141 11/15/2022   K 4.2 11/15/2022   CL 103 11/15/2022   CO2 21 11/15/2022   GLUCOSE 87 11/15/2022   BUN 20 11/15/2022   CREATININE 1.21 (H) 11/15/2022   BILITOT 0.2 11/15/2022   ALKPHOS 113 11/15/2022   AST 17 11/15/2022   ALT 14 11/15/2022   PROT 7.1 11/15/2022   ALBUMIN 4.1 11/15/2022   CALCIUM 9.3 11/15/2022   GFRAA 81  03/04/2018   QFTBGOLD NEGATIVE 08/10/2017    Speciality Comments: No specialty comments available.  Procedures:  No procedures performed Allergies: Pineapple, Adhesive [tape], Codeine, Latex, and Shellfish allergy   Assessment / Plan:     Visit Diagnoses: Rheumatoid factor positive  Bilateral hand swelling - Plan: XR Hand 2 View Right, XR Hand 2 View Left, Cyclic citrul peptide antibody, IgG, Sedimentation rate, C-reactive protein, Mutated Citrullinated Vimentin (MCV) Antibody  Positive rheumatoid factor but there is no peripheral joint synovitis present on exam today.  X-ray of bilateral hands checked is nonspecific some osteoarthritis changes at the thumb and wrist primarily.  Will check additional antibody markers CCP and MCV also serum inflammatory markers for evidence of disease activity.  Unless highly positive would observe and monitor with continued symptomatic management for now.  Fibromyalgia  Not excessively sensitive and tender on exam today but does describe widespread pain and intermittently allodynia features.  Agree with continuing her medication for symptoms such as the Cymbalta 90 mg.  Recommended to review patient fibro guide on symptom management.   Orders: Orders Placed This Encounter  Procedures   XR Hand 2 View Right   XR Hand 2 View Left   Cyclic citrul peptide antibody, IgG   Sedimentation rate   C-reactive protein   Mutated Citrullinated Vimentin (MCV) Antibody   No orders of the defined types were placed in this encounter.    Follow-Up Instructions: No follow-ups on file.   Fuller Plan, MD  Note - This record has been created using [employer].  Chart creation errors have been sought, but may not always  have been located. Such creation errors do not reflect on  the standard of medical care.

## 2023-01-11 NOTE — Patient Instructions (Signed)
I recommend checking out the [employer] patient-centered guide for fibromyalgia and chronic pain management: fibroguide.med.[employer].edu   Myofascial Pain Syndrome and Fibromyalgia Myofascial pain syndrome and fibromyalgia are both pain disorders. You may feel this pain mainly in your muscles. Myofascial pain syndrome: Always has tender points in the muscles that will cause pain when pressed (trigger points). The pain may come and go. Usually affects your neck, upper back, and shoulder areas. The pain often moves into your arms and hands. Fibromyalgia: Has muscle pains and tenderness that come and go. Is often associated with tiredness (fatigue) and sleep problems. Has trigger points. Tends to be long-lasting (chronic), but is not life-threatening. Fibromyalgia and myofascial pain syndrome are not the same. However, they often occur together. If you have both conditions, each can make the other worse. Both are common and can cause enough pain and fatigue to make day-to-day activities difficult. Both can be hard to diagnose because their symptoms are common in many other conditions. What are the causes? The exact causes of these conditions are not known. What increases the risk? You are more likely to develop either of these conditions if: You have a family history of the condition. You are female. You have certain triggers, such as: Spine disorders. An injury (trauma) or other physical stressors. Being under a lot of stress. Medical conditions such as osteoarthritis, rheumatoid arthritis, or lupus. What are the signs or symptoms? Fibromyalgia The main symptom of fibromyalgia is widespread pain and tenderness in your muscles. Pain is sometimes described as stabbing, shooting, or burning. You may also have: Tingling or numbness. Sleep problems and fatigue. Problems with attention and concentration (fibro fog). Other symptoms may include: Bowel and bladder  problems. Headaches. Vision problems. Sensitivity to odors and noises. Depression or mood changes. Painful menstrual periods (dysmenorrhea). Dry skin or eyes. These symptoms can vary over time. Myofascial pain syndrome Symptoms of myofascial pain syndrome include: Tight, ropy bands of muscle. Uncomfortable sensations in muscle areas. These may include aching, cramping, burning, numbness, tingling, and weakness. Difficulty moving certain parts of the body freely (poor range of motion). How is this diagnosed? This condition may be diagnosed by your symptoms and medical history. You will also have a physical exam. In general: Fibromyalgia is diagnosed if you have pain, fatigue, and other symptoms for more than 3 months, and symptoms cannot be explained by another condition. Myofascial pain syndrome is diagnosed if you have trigger points in your muscles, and those trigger points are tender and cause pain elsewhere in your body (referred pain). How is this treated? Treatment for these conditions depends on the type that you have. For fibromyalgia, a healthy lifestyle is the most important treatment including aerobic and strength exercises. Different types of medicines are used to help treat pain and include: NSAIDs. Medicines for treating depression. Medicines that help control seizures. Medicines that relax the muscles. Treatment for myofascial pain syndrome includes: Pain medicines, such as NSAIDs. Cooling and stretching of muscles. Massage therapy with myofascial release technique. Trigger point injections. Treating these conditions often requires a team of health care providers. These may include: Your primary care provider. A physical therapist. Complementary health care providers, such as massage therapists or acupuncturists. A psychiatrist for cognitive behavioral therapy. Follow these instructions at home: Medicines Take over-the-counter and prescription medicines only as told  by your health care provider. Ask your health care provider if the medicine prescribed to you: Requires you to avoid driving or using machinery. Can cause constipation. You   may need to take these actions to prevent or treat constipation: Drink enough fluid to keep your urine pale yellow. Take over-the-counter or prescription medicines. Eat foods that are high in fiber, such as beans, whole grains, and fresh fruits and vegetables. Limit foods that are high in fat and processed sugars, such as fried or sweet foods. Lifestyle  Do exercises as told by your health care provider or physical therapist. Practice relaxation techniques to control your stress. You may want to try: Biofeedback. Visual imagery. Hypnosis. Muscle relaxation. Yoga. Meditation. Maintain a healthy lifestyle. This includes eating a healthy diet and getting enough sleep. Do not use any products that contain nicotine or tobacco. These products include cigarettes, chewing tobacco, and vaping devices, such as e-cigarettes. If you need help quitting, ask your health care provider. General instructions Talk to your health care provider about complementary treatments, such as acupuncture or massage. Do not do activities that stress or strain your muscles. This includes repetitive motions and heavy lifting. Keep all follow-up visits. This is important. Where to find support Consider joining a support group with others who are diagnosed with this condition. National Fibromyalgia Association: fmaware.org Where to find more information U.[place]. Pain Foundation: uspainfoundation.org Contact a health care provider if: You have new symptoms. Your symptoms get worse or your pain is severe. You have side effects from your medicines. You have trouble sleeping. Your condition is causing depression or anxiety. Get help right away if: You have thoughts of hurting yourself or others. Get help right away if you feel like you may hurt  yourself or others, or have thoughts about taking your own life. Go to your nearest emergency room or: Call 911. Call the National Suicide Prevention Lifeline at 1-800-273-8255 or 988. This is open 24 hours a day. Text the Crisis Text Line at 741741. This information is not intended to replace advice given to you by your health care provider. Make sure you discuss any questions you have with your health care provider. Document Revised: 07/17/2022 Document Reviewed: 09/09/2021 Elsevier Patient Education  2023 [employer].  

## 2023-01-15 LAB — C-REACTIVE PROTEIN: CRP: 1.6 mg/L (ref <8.0)

## 2023-01-15 LAB — MUTATED CITRULLINATED VIMENTIN (MCV) ANTIBODY: MUTATED CITRULLINATED VIMENTIN (MCV) AB: <20 U/mL (ref <20)

## 2023-01-15 LAB — SEDIMENTATION RATE: Sed Rate: 33 mm/h — ABNORMAL HIGH (ref 0–30)

## 2023-01-15 LAB — CYCLIC CITRUL PEPTIDE ANTIBODY, IGG: Cyclic Citrullin Peptide Ab: <16 UNITS

## 2023-01-17 ENCOUNTER — Encounter: Payer: Self-pay | Admitting: Nurse Practitioner

## 2023-01-18 ENCOUNTER — Other Ambulatory Visit: Payer: Self-pay | Admitting: Nephrology

## 2023-01-18 DIAGNOSIS — N1831 Chronic kidney disease, stage 3a: Secondary | ICD-10-CM

## 2023-01-25 ENCOUNTER — Ambulatory Visit: Payer: BC Managed Care – PPO

## 2023-01-26 ENCOUNTER — Ambulatory Visit
Admission: RE | Admit: 2023-01-26 | Discharge: 2023-01-26 | Disposition: A | Discharge status: Home / Self Care | Payer: BC Managed Care – PPO | Source: Ambulatory Visit | Attending: Nurse Practitioner | Admitting: Nurse Practitioner

## 2023-01-26 DIAGNOSIS — Z1231 Encounter for screening mammogram for malignant neoplasm of breast: Secondary | ICD-10-CM

## 2023-02-15 ENCOUNTER — Ambulatory Visit: Payer: BC Managed Care – PPO | Admitting: Internal Medicine

## 2023-02-28 ENCOUNTER — Other Ambulatory Visit: Payer: Self-pay | Admitting: Nurse Practitioner

## 2023-02-28 DIAGNOSIS — F329 Major depressive disorder, single episode, unspecified: Secondary | ICD-10-CM

## 2023-02-28 DIAGNOSIS — Z0000 Encounter for general adult medical examination without abnormal findings: Secondary | ICD-10-CM

## 2023-03-01 NOTE — Telephone Encounter (Signed)
Please advise K[facility] 

## 2023-03-05 ENCOUNTER — Ambulatory Visit: Payer: BC Managed Care – PPO | Admitting: Obstetrics and Gynecology

## 2023-03-05 ENCOUNTER — Other Ambulatory Visit (HOSPITAL_COMMUNITY)
Admission: RE | Admit: 2023-03-05 | Discharge: 2023-03-05 | Disposition: A | Discharge status: Home / Self Care | Payer: BC Managed Care – PPO | Source: Ambulatory Visit | Attending: Obstetrics and Gynecology | Admitting: Obstetrics and Gynecology

## 2023-03-05 ENCOUNTER — Encounter: Payer: Self-pay | Admitting: Obstetrics and Gynecology

## 2023-03-05 VITALS — BP 136/97 | HR 67 | Ht 67.0 in | Wt 208.0 lb

## 2023-03-05 DIAGNOSIS — N950 Postmenopausal bleeding: Secondary | ICD-10-CM | POA: Insufficient documentation

## 2023-03-05 NOTE — Progress Notes (Signed)
Referral for post menopausal bleeding Menses in Feb after 3 yrs without menses, lasted 4 day Wants to discuss pregnancy

## 2023-03-05 NOTE — Addendum Note (Signed)
Addended by: Warden Fillers on: 03/05/2023 04:41 PM   Modules accepted: Orders

## 2023-03-05 NOTE — Progress Notes (Signed)
  CC: postmenopausal bleeding. Subjective:    Patient ID: Victoria Curry, female    DOB: 31-Oct-1965, 57 y.o.   MRN: [id number]  HPI 57 yo G5P5 seen for discussion of postmenopausal bleeding in February 2024.  Prior to that episode, pt noted no menses for 2-3 years.  She also experiences vasomotor symptoms.  Pt reports no further bleeding.   Review of Systems     Objective:   Physical Exam Vitals:   03/05/23 1510  BP: (!) 136/97  Pulse: 67   ENDOMETRIAL BIOPSY      Victoria Curry is a 57 y.o. [id number] here for endometrial biopsy.  The indications for endometrial biopsy were reviewed.  Risks of the biopsy including cramping, bleeding, infection, uterine perforation, inadequate specimen and need for additional procedures were discussed. The patient states she understands and agrees to undergo procedure today. Consent was signed. Time out was performed.   Indications: postmenopausal bleeding Urine HCG: pt is in menopauses  A bivalve speculum was placed into the vagina and the cervix was easily visualized and was prepped with Betadine x2. A single-toothed tenaculum was placed on the anterior lip of the cervix to stabilize it. The 3 mm pipelle was introduced into the endometrial cavity without difficulty to a depth of 8 cm, and a moderate amount of tissue was obtained and sent to pathology. This was repeated for a total of 3 passes. The instruments were removed from the patient's vagina. Minimal bleeding from the cervix at the tenaculum was noted.   The patient tolerated the procedure well. Routine post-procedure instructions were given to the patient.    Will base further management on results of biopsy.      Assessment & Plan:   1. Postmenopausal bleeding Awaiting results for endometrial biopsy.  Briefly discussed TVUS, but pt declined due to cost and redundancy.  Follow up in 3-4 weeks for virtual visit.    Warden Fillers, MD Faculty Attending, Center for [facility]

## 2023-03-07 LAB — SURGICAL PATHOLOGY

## 2023-03-26 ENCOUNTER — Encounter: Payer: Self-pay | Admitting: Nurse Practitioner

## 2023-03-26 ENCOUNTER — Other Ambulatory Visit: Payer: Self-pay | Admitting: Nurse Practitioner

## 2023-03-26 ENCOUNTER — Ambulatory Visit (INDEPENDENT_AMBULATORY_CARE_PROVIDER_SITE_OTHER): Payer: BC Managed Care – PPO | Admitting: Nurse Practitioner

## 2023-03-26 VITALS — BP 110/80 | HR 88 | Temp 97.3°F | Ht 64.0 in | Wt 209.6 lb

## 2023-03-26 DIAGNOSIS — I10 Essential (primary) hypertension: Secondary | ICD-10-CM | POA: Diagnosis not present

## 2023-03-26 DIAGNOSIS — M545 Low back pain, unspecified: Secondary | ICD-10-CM | POA: Diagnosis not present

## 2023-03-26 DIAGNOSIS — F419 Anxiety disorder, unspecified: Secondary | ICD-10-CM

## 2023-03-26 DIAGNOSIS — G8929 Other chronic pain: Secondary | ICD-10-CM

## 2023-03-26 DIAGNOSIS — Z0000 Encounter for general adult medical examination without abnormal findings: Secondary | ICD-10-CM

## 2023-03-26 DIAGNOSIS — F329 Major depressive disorder, single episode, unspecified: Secondary | ICD-10-CM

## 2023-03-26 MED ORDER — KETOROLAC TROMETHAMINE 60 MG/2ML IM SOLN
60.0000 mg | Freq: Once | INTRAMUSCULAR | Status: AC
Start: 2023-03-26 — End: 2023-03-26
  Administered 2023-03-26: 60 mg via INTRAMUSCULAR

## 2023-03-26 MED ORDER — CYCLOBENZAPRINE HCL 5 MG PO TABS
5.0000 mg | ORAL_TABLET | Freq: Three times a day (TID) | ORAL | 0 refills | Status: DC | PRN
Start: 2023-03-26 — End: 2023-04-09

## 2023-03-26 MED ORDER — PREDNISONE 10 MG (21) PO TBPK
ORAL_TABLET | ORAL | 0 refills | Status: AC
Start: 2023-03-26 — End: ?

## 2023-03-26 MED ORDER — PREDNISONE 5 MG (21) PO TBPK
ORAL_TABLET | ORAL | 0 refills | Status: DC
Start: 2023-03-26 — End: 2023-03-26

## 2023-03-26 NOTE — Assessment & Plan Note (Addendum)
  1. Chronic midline low back pain without sciatica  - cyclobenzaprine (FLEXERIL) 5 MG tablet; Take 1 tablet (5 mg total) by mouth 3 (three) times daily as needed for muscle spasms.  Dispense: 30 tablet; Refill: 0 - ketorolac (TORADOL) injection 60 mg - predniSONE (STERAPRED UNI-PAK 21 TAB) 10 MG (21) TBPK tablet; Take as directed on the packaging  Dispense: 1 each; Refill: 0  Patient told to take prednisone with meals, she was encouraged not to take ibuprofen with prednisone.  Continue Tylenol 650 mg every 6 hours as needed.  Application of heat or ice stretching exercises encouraged. She was told not to take Valium with Flexeril Will refer to orthopedics if no improvement      Imaging studies done in January shows  Narrative & Impression  CLINICAL DATA:  Chronic low back pain.   EXAM: LUMBAR SPINE - COMPLETE 4+ VIEW   COMPARISON:  None Available.   FINDINGS: There is no evidence of lumbar spine fracture. Alignment is normal. Mild intervertebral disc space narrowing and endplate osteophyte formation at L2-L3 and L3-L4. Calcified masses are noted in the pelvis, likely degenerating fibroids.   IMPRESSION: Mild degenerative changes in the lumbar spine.

## 2023-03-26 NOTE — Telephone Encounter (Signed)
Please advise K[facility] 

## 2023-03-26 NOTE — Assessment & Plan Note (Signed)
BP Readings from Last 3 Encounters:  03/26/23 110/80  03/05/23 (!) 136/97  01/11/23 115/75   HTN Controlled on amlodipine 10 mg daily Continue current medications. No changes in management. Discussed DASH diet and dietary sodium restrictions Continue to increase dietary efforts and exercise.

## 2023-03-26 NOTE — Progress Notes (Signed)
Acute Office Visit Hello [name] how are you this is  Subjective:     Patient ID: Victoria Curry, female    DOB: [birth year], 58 y.o.   MRN: [id number]  Chief Complaint  Patient presents with   Back Pain    Back pain started Sunday morning.    HPI Victoria Curry  has a past medical history of Anemia, Chronic kidney disease (CKD) (12/2022), Depression, Fibromyalgia (11/2002), GERD (gastroesophageal reflux disease), Hypertension, and Vitamin D deficiency (12/2020).   Patient presents with complaints of worsening chronic low back pain that goes worse yesterday.  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  She currently has sharp achy pain rated 8/10.  Stated it is difficult moving due to the pain.  Took some diazepam that she got from her son that helped.  She also took some ibuprofen and Tylenol.  She has been applying heat and ice without improvement.  No fever, chills, chest pain, shortness of breath, urinary or bladder incontinence.    Review of Systems  Constitutional:  Negative for activity change, appetite change, chills, fatigue and fever.  HENT:  Negative for congestion, dental problem, ear discharge, ear pain and hearing loss.   Respiratory:  Negative for cough, chest tightness, shortness of breath and wheezing.   Cardiovascular:  Negative for chest pain, palpitations and leg swelling.  Gastrointestinal:  Negative for abdominal distention, abdominal pain, blood in stool, constipation, diarrhea, nausea and vomiting.  Genitourinary:  Negative for difficulty urinating, dysuria, flank pain, frequency, hematuria, menstrual problem, pelvic pain and vaginal bleeding.  Musculoskeletal:  Positive for arthralgias and back pain. Negative for gait problem, joint swelling and myalgias.  Skin:  Negative for color change, pallor, rash and wound.  Allergic/Immunologic: Negative for  environmental allergies, food allergies and immunocompromised state.  Neurological:  Negative for dizziness, tremors, facial asymmetry, weakness and headaches.  Hematological:  Negative for adenopathy. Does not bruise/bleed easily.  Psychiatric/Behavioral:  Negative for agitation, behavioral problems, confusion, decreased concentration, hallucinations, self-injury and suicidal ideas.         Objective:    BP 110/80   Pulse 88   Temp (!) 97.3 F (36.3 C)   Ht 5\' 4"  (1.626 m)   Wt 209 lb 9.6 oz (95.1 kg)   LMP 12/21/2022 (Exact Date) Comment: 1st period in 3 years, 1 week following intercourse  SpO2 98%   BMI 35.98 kg/m    Physical Exam Vitals and nursing note reviewed.  Constitutional:      General: She is not in acute distress.    Appearance: Normal appearance. She is obese. She is not ill-appearing, toxic-appearing or diaphoretic.  HENT:     Mouth/Throat:     Mouth: Mucous membranes are moist.     Pharynx: Oropharynx is clear. No oropharyngeal exudate or posterior oropharyngeal erythema.  Eyes:     General: No scleral icterus.       Right eye: No discharge.        Left eye: No discharge.     Extraocular Movements: Extraocular movements intact.     Conjunctiva/sclera: Conjunctivae normal.  Cardiovascular:     Rate and Rhythm: Normal rate and regular rhythm.     Pulses: Normal pulses.     Heart sounds: Normal heart sounds. No murmur heard.    No friction rub. No gallop.  Pulmonary:  Effort: Pulmonary effort is normal. No respiratory distress.     Breath sounds: Normal breath sounds. No stridor. No wheezing, rhonchi or rales.  Chest:     Chest wall: No tenderness.  Abdominal:     General: There is no distension.     Palpations: Abdomen is soft.     Tenderness: There is no abdominal tenderness. There is no right CVA tenderness, left CVA tenderness or guarding.  Musculoskeletal:        General: Tenderness present. No swelling, deformity or signs of injury.      Right lower leg: No edema.     Left lower leg: No edema.     Comments: Tenderness on palpation of lower back.  No redness, no swelling noted  Skin:    General: Skin is warm and dry.     Capillary Refill: Capillary refill takes less than 2 seconds.     Coloration: Skin is not jaundiced or pale.     Findings: No bruising, erythema or lesion.  Neurological:     Mental Status: She is alert and oriented to person, place, and time.     Motor: No weakness.     Coordination: Coordination normal.     Gait: Gait normal.  Psychiatric:        Mood and Affect: Mood normal.        Behavior: Behavior normal.        Thought Content: Thought content normal.        Judgment: Judgment normal.     No results found for any visits on 03/26/23.      Assessment & Plan:   Problem List Items Addressed This Visit       Cardiovascular and Mediastinum   Essential hypertension    BP Readings from Last 3 Encounters:  03/26/23 110/80  03/05/23 (!) 136/97  01/11/23 115/75   HTN Controlled on amlodipine 10 mg daily Continue current medications. No changes in management. Discussed DASH diet and dietary sodium restrictions Continue to increase dietary efforts and exercise.           Other   Chronic midline low back pain without sciatica - Primary     1. Chronic midline low back pain without sciatica  - cyclobenzaprine (FLEXERIL) 5 MG tablet; Take 1 tablet (5 mg total) by mouth 3 (three) times daily as needed for muscle spasms.  Dispense: 30 tablet; Refill: 0 - ketorolac (TORADOL) injection 60 mg - predniSONE (STERAPRED UNI-PAK 21 TAB) 10 MG (21) TBPK tablet; Take as directed on the packaging  Dispense: 1 each; Refill: 0  Patient told to take prednisone with meals, she was encouraged not to take ibuprofen with prednisone.  Continue Tylenol 650 mg every 6 hours as needed.  Application of heat or ice stretching exercises encouraged. She was told not to take Valium with Flexeril Will refer to  orthopedics if no improvement      Imaging studies done in January shows  Narrative & Impression  CLINICAL DATA:  Chronic low back pain.   EXAM: LUMBAR SPINE - COMPLETE 4+ VIEW   COMPARISON:  None Available.   FINDINGS: There is no evidence of lumbar spine fracture. Alignment is normal. Mild intervertebral disc space narrowing and endplate osteophyte formation at L2-L3 and L3-L4. Calcified masses are noted in the pelvis, likely degenerating fibroids.   IMPRESSION: Mild degenerative changes in the lumbar spine.       Relevant Medications   cyclobenzaprine (FLEXERIL) 5 MG tablet   predniSONE (STERAPRED UNI-PAK  21 TAB) 10 MG (21) TBPK tablet    Meds ordered this encounter  Medications   cyclobenzaprine (FLEXERIL) 5 MG tablet    Sig: Take 1 tablet (5 mg total) by mouth 3 (three) times daily as needed for muscle spasms.    Dispense:  30 tablet    Refill:  0   DISCONTD: predniSONE (STERAPRED UNI-PAK 21 TAB) 5 MG (21) TBPK tablet    Sig: Take as instructed on the packaging    Dispense:  1 each    Refill:  0   ketorolac (TORADOL) injection 60 mg   predniSONE (STERAPRED UNI-PAK 21 TAB) 10 MG (21) TBPK tablet    Sig: Take as directed on the packaging    Dispense:  1 each    Refill:  0    No follow-ups on file.  Donell Beers, FNP

## 2023-03-26 NOTE — Patient Instructions (Addendum)
1. Chronic midline low back pain without sciatica  - cyclobenzaprine (FLEXERIL) 5 MG tablet; Take 1 tablet (5 mg total) by mouth 3 (three) times daily as needed for muscle spasms.  Dispense: 30 tablet; Refill: 0 - ketorolac (TORADOL) injection 60 mg - predniSONE (STERAPRED UNI-PAK 21 TAB) 10 MG (21) TBPK tablet; Take as directed on the packaging  Dispense: 1 each; Refill: 0  2. Essential hypertension   You were given toradol 60mg  injection in the office today   Please take prednisone with meal. Do not take with ibuprofen.  Take tylenol 650 mg every 6-8 hours as needed for pain     It is important that you exercise regularly at least 30 minutes 5 times a week as tolerated  Think about what you will eat, plan ahead. Choose " clean, green, fresh or frozen" over canned, processed or packaged foods which are more sugary, salty and fatty. 70 to 75% of food eaten should be vegetables and fruit. Three meals at set times with snacks allowed between meals, but they must be fruit or vegetables. Aim to eat over a 12 hour period , example 7 am to 7 pm, and STOP after  your last meal of the day. Drink water,generally about 64 ounces per day, no other drink is as healthy. Fruit juice is best enjoyed in a healthy way, by EATING the fruit.  Thanks for choosing [facility] we consider it a privelige to serve you.

## 2023-04-09 ENCOUNTER — Other Ambulatory Visit: Payer: Self-pay

## 2023-04-09 DIAGNOSIS — Z0000 Encounter for general adult medical examination without abnormal findings: Secondary | ICD-10-CM

## 2023-04-09 DIAGNOSIS — F419 Anxiety disorder, unspecified: Secondary | ICD-10-CM

## 2023-04-09 DIAGNOSIS — I10 Essential (primary) hypertension: Secondary | ICD-10-CM

## 2023-04-09 DIAGNOSIS — F329 Major depressive disorder, single episode, unspecified: Secondary | ICD-10-CM

## 2023-04-09 DIAGNOSIS — M545 Low back pain, unspecified: Secondary | ICD-10-CM

## 2023-04-09 MED ORDER — LOSARTAN POTASSIUM 100 MG PO TABS
100.0000 mg | ORAL_TABLET | Freq: Every day | ORAL | 0 refills | Status: DC
Start: 2023-04-09 — End: 2023-07-09

## 2023-04-09 MED ORDER — CYCLOBENZAPRINE HCL 5 MG PO TABS
5.0000 mg | ORAL_TABLET | Freq: Three times a day (TID) | ORAL | 0 refills | Status: DC | PRN
Start: 2023-04-09 — End: 2023-05-16

## 2023-04-09 MED ORDER — HYDROXYZINE HCL 25 MG PO TABS: 25.0000 mg | ORAL_TABLET | Freq: Three times a day (TID) | ORAL | 0 refills | Status: DC | PRN

## 2023-04-09 MED ORDER — DULOXETINE HCL 30 MG PO CPEP: 90.0000 mg | ORAL_CAPSULE | Freq: Every day | ORAL | 0 refills | Status: DC

## 2023-04-09 MED ORDER — BUSPIRONE HCL 15 MG PO TABS: 15.0000 mg | ORAL_TABLET | Freq: Two times a day (BID) | ORAL | 0 refills | Status: DC

## 2023-04-09 NOTE — Telephone Encounter (Signed)
From: [name] To: Office of Donell Beers, [place] Sent: 04/07/2023 9:35 AM EDT Subject: Medication Renewal Request  Refills have been requested for the following medications:   cyclobenzaprine (FLEXERIL) 5 MG tablet [[employer]]  Preferred pharmacy: [id number] - [place], [place] - [address] Delivery method: Pickup   Medication renewals requested in this message routed separately:   hydrOXYzine (ATARAX) 25 MG tablet [Tonya S Nichols]   DULoxetine (CYMBALTA) 30 MG capsule [Tonya S Nichols]   busPIRone (BUSPAR) 15 MG tablet [Tonya S Nichols]   losartan (COZAAR) 100 MG tablet [Tonya S Nichols]

## 2023-04-09 NOTE — Telephone Encounter (Signed)
Please advise Kh 

## 2023-04-09 NOTE — Telephone Encounter (Signed)
Please advise K[facility] 

## 2023-04-12 ENCOUNTER — Telehealth (INDEPENDENT_AMBULATORY_CARE_PROVIDER_SITE_OTHER): Payer: BC Managed Care – PPO | Admitting: Obstetrics and Gynecology

## 2023-04-12 DIAGNOSIS — N950 Postmenopausal bleeding: Secondary | ICD-10-CM

## 2023-04-12 NOTE — Patient Instructions (Signed)
The procedure would be hysteroscopy dilation and curettage with possible polyp/fibroid resection

## 2023-04-12 NOTE — Progress Notes (Signed)
    GYNECOLOGY VIRTUAL VISIT ENCOUNTER NOTE  Provider location: [facility]   Patient location: Home  I connected with Victoria Curry on 04/12/23 at 10:35 AM EDT by MyChart Video Encounter and verified that I am speaking with the correct person using two identifiers.   I discussed the limitations, risks, security and privacy concerns of performing an evaluation and management service virtually and the availability of in person appointments. I also discussed with the patient that there may be a patient responsible charge related to this service. The patient expressed understanding and agreed to proceed.   History:  Victoria Curry is a 57 y.o. [id number] female being evaluated today for postmenopausal bleeding.  Endometrial biopsy was normal, but pt notes recurrence of vaginal bleeding as of yesterday.    Past Medical History:  Diagnosis Date   Anemia    Chronic kidney disease (CKD) 12/2022   Depression    Fibromyalgia 11/2002   GERD (gastroesophageal reflux disease)    Hypertension    Vitamin D deficiency 12/2020   Past Surgical History:  Procedure Laterality Date   BREAST LUMPECTOMY  10/1992   The following portions of the patient's history were reviewed and updated as appropriate: allergies, current medications, past family history, past medical history, past social history, past surgical history and problem list.   Health Maintenance:  Normal pap and negative HRHPV on 1/24.  Normal mammogram on 4/24.   Review of Systems:  Pertinent items noted in HPI and remainder of comprehensive ROS otherwise negative.  Physical Exam:   General:  Alert, oriented and cooperative. Patient appears to be in no acute distress.  Mental Status: Normal mood and affect. Normal behavior. Normal judgment and thought content.   Respiratory: Normal respiratory effort, no problems with respiration noted  Rest of physical exam deferred due to type of encounter  Labs and  Imaging No results found for this or any previous visit (from the past 336 hour(s)). No results found.     Assessment and Plan:     Postmenopausal bleeding:  Even though endometrial biopsy was completely normal, recurrence of bleeding and strong family history dictate further evaluation.  I have proposed hysteroscopy D and for both diagnostic and therapeutic treatment. Pt is in agreement.      I discussed the assessment and treatment plan with the patient. The patient was provided an opportunity to ask questions and all were answered. The patient agreed with the plan and demonstrated an understanding of the instructions.   The patient was advised to call back or seek an in-person evaluation/go to the ED if the symptoms worsen or if the condition fails to improve as anticipated.  I provided 20 minutes of face-to-face time during this encounter.   Warden Fillers, MD Center for [employer], [facility]

## 2023-04-12 NOTE — Progress Notes (Signed)
MyChart FU for Postmenopausal Bleeding.  Pt c/o starting her period yesterday (04/11/23), normal flow.

## 2023-05-15 ENCOUNTER — Encounter: Payer: Self-pay | Admitting: Obstetrics and Gynecology

## 2023-05-16 ENCOUNTER — Encounter: Payer: Self-pay | Admitting: Nurse Practitioner

## 2023-05-16 ENCOUNTER — Ambulatory Visit: Payer: BC Managed Care – PPO | Admitting: Nurse Practitioner

## 2023-05-16 VITALS — BP 145/93 | HR 77 | Temp 97.0°F | Wt 223.0 lb

## 2023-05-16 DIAGNOSIS — Z1211 Encounter for screening for malignant neoplasm of colon: Secondary | ICD-10-CM | POA: Diagnosis not present

## 2023-05-16 DIAGNOSIS — M5136 Other intervertebral disc degeneration, lumbar region: Secondary | ICD-10-CM

## 2023-05-16 DIAGNOSIS — M545 Low back pain, unspecified: Secondary | ICD-10-CM

## 2023-05-16 DIAGNOSIS — I10 Essential (primary) hypertension: Secondary | ICD-10-CM | POA: Diagnosis not present

## 2023-05-16 DIAGNOSIS — F32A Depression, unspecified: Secondary | ICD-10-CM | POA: Diagnosis not present

## 2023-05-16 DIAGNOSIS — G8929 Other chronic pain: Secondary | ICD-10-CM

## 2023-05-16 MED ORDER — CYCLOBENZAPRINE HCL 10 MG PO TABS: 10.0000 mg | ORAL_TABLET | Freq: Three times a day (TID) | ORAL | 0 refills | Status: DC | PRN

## 2023-05-16 NOTE — Progress Notes (Signed)
@Patient  ID: Victoria Curry, female    DOB: 1966-01-12, 57 y.o.   MRN: [id number]  Chief Complaint  Patient presents with   Follow-up    Referring provider: Ivonne Andrew, NP   HPI  Victoria Curry  has a past medical history of Anemia, Chronic kidney disease (CKD) (12/2022), Depression, Fibromyalgia (11/2002), GERD (gastroesophageal reflux disease), Hypertension, and Vitamin D deficiency (12/2020).   Patient presents today for a follow-up visit.  She would like a referral to psychiatry for depression.  Will place referral today.  Blood pressures were elevated in office today.  Patient states that she is having low back pain that has been chronic.  We will refer her to Ortho.  Previous X-ray did show degenerative disc disease.      Allergies  Allergen Reactions   Pineapple Swelling   Adhesive [Tape] Other (See Comments)    Skin irritation    Codeine Itching   Latex Other (See Comments)    Skin irritation    Shellfish Allergy     Immunization History  Administered Date(s) Administered   Hepatitis B, ADULT 08/10/2017, 09/11/2017   Influenza,inj,Quad PF,6+ Mos 11/11/2015, 08/11/2016, 08/10/2017, 07/02/2019   PFIZER(Purple Top)SARS-COV-2 Vaccination 12/29/2019, 02/25/2020   Tdap 08/11/2016    Past Medical History:  Diagnosis Date   Anemia    Chronic kidney disease (CKD) 12/2022   Depression    Fibromyalgia 11/2002   GERD (gastroesophageal reflux disease)    Hypertension    Vitamin D deficiency 12/2020    Tobacco History: Social History   Tobacco Use  Smoking Status Never   Passive exposure: Past  Smokeless Tobacco Never   Counseling given: Not Answered   Outpatient Encounter Medications as of 05/16/2023  Medication Sig   acetaminophen (TYLENOL) 650 MG CR tablet Take 650 mg by mouth every 8 (eight) hours as needed for pain.   amLODipine (NORVASC) 10 MG tablet Take 1 tablet (10 mg total) by mouth daily.   busPIRone (BUSPAR) 15 MG tablet Take 1 tablet (15  mg total) by mouth 2 (two) times daily.   cyclobenzaprine (FLEXERIL) 10 MG tablet Take 1 tablet (10 mg total) by mouth 3 (three) times daily as needed for muscle spasms.   DULoxetine (CYMBALTA) 30 MG capsule Take 3 capsules (90 mg total) by mouth daily.   hydrOXYzine (ATARAX) 25 MG tablet Take 1 tablet (25 mg total) by mouth 3 (three) times daily as needed.   Inositol Niacinate 500 MG CAPS Take by mouth.   losartan (COZAAR) 100 MG tablet Take 1 tablet (100 mg total) by mouth daily.   Methylsulfonylmethane (MSM) 1000 MG TABS Take 1,000 mg by mouth daily.   [DISCONTINUED] cyclobenzaprine (FLEXERIL) 5 MG tablet Take 1 tablet (5 mg total) by mouth 3 (three) times daily as needed for muscle spasms.   diazepam (VALIUM) 5 MG tablet Take 5 mg by mouth every 6 (six) hours as needed for anxiety. (Patient not taking: Reported on 05/16/2023)   ferrous fumarate (HEMOCYTE - 106 MG FE) 325 (106 Fe) MG TABS tablet Take 1 tablet (106 mg of iron total) by mouth daily. (Patient not taking: Reported on 01/11/2023)   ibuprofen (ADVIL,MOTRIN) 600 MG tablet Take 1 tablet (600 mg total) by mouth every 8 (eight) hours as needed. (Patient not taking: Reported on 05/16/2023)   Iron-Vitamins (GERITOL COMPLETE PO) Take by mouth. (Patient not taking: Reported on 05/16/2023)   norethindrone-ethinyl estradiol-iron (MICROGESTIN FE 1.5/30) 1.5-30 MG-MCG tablet Take 1 tablet by mouth daily. (Patient not taking:  Reported on 01/11/2023)   predniSONE (STERAPRED UNI-PAK 21 TAB) 10 MG (21) TBPK tablet Take as directed on the packaging (Patient not taking: Reported on 05/16/2023)   Vitamin D, Ergocalciferol, (DRISDOL) 1.25 MG (50000 UNIT) CAPS capsule Take 1 capsule (50,000 Units total) by mouth every 7 (seven) days. (Patient not taking: Reported on 01/11/2023)   No facility-administered encounter medications on file as of 05/16/2023.     Review of Systems  Review of Systems  Constitutional: Negative.   HENT: Negative.    Cardiovascular:  Negative.   Gastrointestinal: Negative.   Musculoskeletal:  Positive for back pain.  Allergic/Immunologic: Negative.   Neurological: Negative.   Psychiatric/Behavioral: Negative.         Physical Exam  BP (!) 145/93   Pulse 77   Temp (!) 97 F (36.1 C)   Wt 223 lb (101.2 kg)   SpO2 100%   BMI 38.28 kg/m   Wt Readings from Last 5 Encounters:  05/16/23 223 lb (101.2 kg)  03/26/23 209 lb 9.6 oz (95.1 kg)  03/05/23 208 lb (94.3 kg)  01/11/23 202 lb (91.6 kg)  11/15/22 201 lb 3.2 oz (91.3 kg)     Physical Exam Vitals and nursing note reviewed.  Constitutional:      General: She is not in acute distress.    Appearance: She is well-developed.  Cardiovascular:     Rate and Rhythm: Normal rate and regular rhythm.  Pulmonary:     Effort: Pulmonary effort is normal.     Breath sounds: Normal breath sounds.  Neurological:     Mental Status: She is alert and oriented to person, place, and time.        Assessment & Plan:   Colon cancer screening - Cologuard  2. Depression, unspecified depression type  - Ambulatory referral to Psychiatry - CBC; Future - Comprehensive metabolic panel; Future  3. Essential hypertension  - Recheck vitals  4. Chronic bilateral low back pain without sciatica  - Ambulatory referral to Orthopedics  5. DDD (degenerative disc disease), lumbar  - Ambulatory referral to Orthopedics   Follow up:  Follow up in 6 months      Ivonne Andrew, NP 05/17/2023

## 2023-05-16 NOTE — Patient Instructions (Addendum)
1. Colon cancer screening  - Cologuard  2. Depression, unspecified depression type  - Ambulatory referral to Psychiatry - CBC; Future - Comprehensive metabolic panel; Future  3. Essential hypertension  - Recheck vitals  4. Chronic bilateral low back pain without sciatica  - Ambulatory referral to Orthopedics  5. DDD (degenerative disc disease), lumbar  - Ambulatory referral to Orthopedics   Follow up:  Follow up in 6 months

## 2023-05-17 ENCOUNTER — Encounter: Payer: Self-pay | Admitting: Nurse Practitioner

## 2023-05-17 DIAGNOSIS — Z1211 Encounter for screening for malignant neoplasm of colon: Secondary | ICD-10-CM | POA: Insufficient documentation

## 2023-05-17 NOTE — Assessment & Plan Note (Addendum)
-   Cologuard  2. Depression, unspecified depression type  - Ambulatory referral to Psychiatry - CBC; Future - Comprehensive metabolic panel; Future  3. Essential hypertension  - Recheck vitals  4. Chronic bilateral low back pain without sciatica  - Ambulatory referral to Orthopedics  5. DDD (degenerative disc disease), lumbar  - Ambulatory referral to Orthopedics   Follow up:  Follow up in 6 months

## 2023-05-19 ENCOUNTER — Other Ambulatory Visit: Payer: Self-pay | Admitting: Nurse Practitioner

## 2023-05-20 ENCOUNTER — Other Ambulatory Visit: Payer: Self-pay | Admitting: Nurse Practitioner

## 2023-05-20 DIAGNOSIS — I10 Essential (primary) hypertension: Secondary | ICD-10-CM

## 2023-05-22 ENCOUNTER — Inpatient Hospital Stay (HOSPITAL_COMMUNITY)
Admission: RE | Admit: 2023-05-22 | Payer: BC Managed Care – PPO | Source: Home / Self Care | Admitting: Obstetrics and Gynecology

## 2023-05-22 ENCOUNTER — Encounter (HOSPITAL_COMMUNITY): Admission: RE | Payer: Self-pay | Source: Home / Self Care

## 2023-05-22 ENCOUNTER — Encounter: Payer: Self-pay | Admitting: Physical Medicine and Rehabilitation

## 2023-05-22 ENCOUNTER — Ambulatory Visit (HOSPITAL_COMMUNITY)
Admission: RE | Admit: 2023-05-22 | Payer: BC Managed Care – PPO | Source: Home / Self Care | Admitting: Obstetrics and Gynecology

## 2023-05-22 ENCOUNTER — Ambulatory Visit: Payer: BC Managed Care – PPO | Admitting: Physical Medicine and Rehabilitation

## 2023-05-22 DIAGNOSIS — M545 Low back pain, unspecified: Secondary | ICD-10-CM

## 2023-05-22 DIAGNOSIS — M797 Fibromyalgia: Secondary | ICD-10-CM | POA: Diagnosis not present

## 2023-05-22 DIAGNOSIS — M7918 Myalgia, other site: Secondary | ICD-10-CM

## 2023-05-22 SURGERY — DILATATION & CURETTAGE/HYSTEROSCOPY WITH MYOSURE
Anesthesia: Choice

## 2023-05-22 MED ORDER — METHOCARBAMOL 500 MG PO TABS: 500.0000 mg | ORAL_TABLET | Freq: Three times a day (TID) | ORAL | 0 refills | Status: DC

## 2023-05-22 NOTE — Progress Notes (Unsigned)
Victoria Curry - 57 y.o. female MRN [id number]  Date of birth: 1966/04/26  Office Visit Note: Visit Date: 05/22/2023 PCP: Ivonne Andrew, NP Referred by: Ivonne Andrew, NP  Subjective: Chief Complaint  Patient presents with   Lower Back - Pain   HPI: Victoria Curry is a 57 y.o. female who comes in today per the request of Angus Seller, NP for evaluation of acute bilateral lower back pain. Patient reports history of fibromyalgia and chronic pain. She struggles with diffuse body pain daily. Pain started in June suddenly, no injury associated with pain. Her pain worsens with movement and activity, she describes pain as sore, burning and aching sensation, currently rates as 5 out of 10. Some relief of pain with home exercise regimen, rest and use of medications. Some relief of Flexeril. States she is also taking son's Valium that seems to significantly help with her pain. No history of formal physical therapy. Recent lumbar x-rays exhibit mild degenerative changes. No history of lumbar surgery/injection. Her fibromyalgia is currently being managed by her PCP, she is currently taking Cymbalta 20 BID. Patient states she works multiple jobs and feels high stress level does contribute to her symptoms. She was previously evaluated by Dr. Sheliah Hatch, he recommended fibromyalgia symptom management and continued monitoring, no significant rheumatological findings noted. Patient denies focal weakness, numbness and tingling. No recent trauma or falls.      Review of Systems  Musculoskeletal:  Positive for back pain.  Neurological:  Negative for tingling, sensory change, focal weakness and weakness.  All other systems reviewed and are negative.  Otherwise per HPI.  Assessment & Plan: Visit Diagnoses:    ICD-10-CM   1. Myofascial pain syndrome  M79.18 Ambulatory referral to Physical Therapy    2. Acute bilateral low back pain without sciatica  M54.50 Ambulatory referral to Physical  Therapy    3. Fibromyalgia  M79.7 Ambulatory referral to Physical Therapy       Plan: Findings:  Acute bilateral lower back pain. Patient continues to have severe pain despite good conservative therapies such as home exercise regimen, rest and use of medications. Patients clinical presentation and exam are consistent with myofascial pain syndrome. I also feel her fibromyalgia is working to exacerbate her symptoms. We do not feel her symptoms directly correlate with her lumbar spine. We discussed treatment plan in detail today, next step is to place order for short course of formal physical therapy. I do feel she would benefit from manual treatments and dry needling. I also changed her medication to Robaxin, she is to stop Flexeril. She is unable to take anti-inflammatory medications due to chronic kidney issues. Although, we do not solely treat fibromyalgia in our office I emphasized importance of good sleep habits, consistent physical exercise and stress reduction. Patient can speak with her PCP for further fibromyalgia treatment. If her pain persists we did discuss possibility of obtaining lumbar MRI imaging. We are happy to see patient back as needed.     Meds & Orders:  Meds ordered this encounter  Medications   methocarbamol (ROBAXIN) 500 MG tablet    Sig: Take 1 tablet (500 mg total) by mouth 3 (three) times daily.    Dispense:  90 tablet    Refill:  0    Orders Placed This Encounter  Procedures   Ambulatory referral to Physical Therapy    Follow-up: Return if symptoms worsen or fail to improve.   Procedures: No procedures performed  Clinical History: EXAM: LUMBAR SPINE - COMPLETE 4+ VIEW   COMPARISON:  None Available.   FINDINGS: There is no evidence of lumbar spine fracture. Alignment is normal. Mild intervertebral disc space narrowing and endplate osteophyte formation at L2-L3 and L3-L4. Calcified masses are noted in the pelvis, likely degenerating fibroids.    IMPRESSION: Mild degenerative changes in the lumbar spine.      Relevant Medications   She reports that she has never smoked. She has been exposed to tobacco smoke. She has never used smokeless tobacco. No results for input(s): "HGBA1C", "LABURIC" in the last 8760 hours.  Objective:  VS:  HT:    WT:   BMI:     BP:   HR: bpm  TEMP: ( )  RESP:  Physical Exam Vitals and nursing note reviewed.  HENT:     Head: Normocephalic and atraumatic.     Right Ear: External ear normal.     Left Ear: External ear normal.     Nose: Nose normal.     Mouth/Throat:     Mouth: Mucous membranes are moist.  Eyes:     Extraocular Movements: Extraocular movements intact.  Cardiovascular:     Rate and Rhythm: Normal rate.     Pulses: Normal pulses.  Pulmonary:     Effort: Pulmonary effort is normal.  Abdominal:     General: Abdomen is flat. There is no distension.  Musculoskeletal:        General: Tenderness present.     Cervical back: Normal range of motion.     Comments: Patient rises from seated position to standing without difficulty. Good lumbar range of motion. No pain noted with facet loading. 5/5 strength noted with bilateral hip flexion, knee flexion/extension, ankle dorsiflexion/plantarflexion and EHL. No clonus noted bilaterally. No pain upon palpation of greater trochanters. No pain with internal/external rotation of bilateral hips. Sensation intact bilaterally. Tenderness noted to bilateral thoracic and lumbar paraspinal regions. Negative slump test bilaterally. Ambulates without aid, gait steady.     Skin:    General: Skin is warm and dry.     Capillary Refill: Capillary refill takes less than 2 seconds.  Neurological:     General: No focal deficit present.     Mental Status: She is alert and oriented to person, place, and time.  Psychiatric:        Mood and Affect: Mood normal.        Behavior: Behavior normal.     Ortho Exam  Imaging: No results found.  Past  Medical/Family/Surgical/Social History: Medications & Allergies reviewed per EMR, new medications updated. Patient Active Problem List   Diagnosis Date Noted   Colon cancer screening 05/17/2023   Chronic midline low back pain without sciatica 03/26/2023   Postmenopausal bleeding 03/05/2023   Rheumatoid factor positive 01/11/2023   Bilateral hand swelling 01/11/2023   Iron deficiency 11/16/2022   Vitamin D deficiency 01/25/2022   Fibroid uterus 08/03/2017   Menorrhagia with regular cycle 08/03/2017   Acute non intractable tension-type headache 12/25/2016   Nausea 12/25/2016   Masses of both breasts 12/29/2015   Absolute anemia 11/13/2015   Essential hypertension 10/09/2015   Gastroesophageal reflux disease without esophagitis 10/09/2015   Metabolic syndrome 10/09/2015   Fibromyalgia 10/09/2015   Depression 10/09/2015   Past Medical History:  Diagnosis Date   Anemia    Chronic kidney disease (CKD) 12/2022   Depression    Fibromyalgia 11/2002   GERD (gastroesophageal reflux disease)    Hypertension  Vitamin D deficiency 12/2020   Family History  Problem Relation Age of Onset   Diabetes Mother    Hypertension Mother    Alzheimer's disease Mother    Cancer Mother    Heart disease Mother    Bursitis Mother    Hypertension Father    Heart disease Father    Cancer Father        testiculiar    Past Surgical History:  Procedure Laterality Date   BREAST LUMPECTOMY  10/1992   Social History   Occupational History   Not on file  Tobacco Use   Smoking status: Never    Passive exposure: Past   Smokeless tobacco: Never  Vaping Use   Vaping status: Never Used  Substance and Sexual Activity   Alcohol use: No   Drug use: No   Sexual activity: Yes

## 2023-05-22 NOTE — Progress Notes (Unsigned)
Functional Pain Scale - descriptive words and definitions  Moderate (4)   Constantly aware of pain, can complete ADLs with modification/sleep marginally affected at times/passive distraction is of no use, but active distraction gives some relief. Moderate range order  Average Pain 7  Lower back pain on both sides. It was radiating in legs a month ago, but not now. Pain flare up 6 days ago

## 2023-05-24 ENCOUNTER — Other Ambulatory Visit: Payer: BC Managed Care – PPO

## 2023-05-24 ENCOUNTER — Ambulatory Visit (INDEPENDENT_AMBULATORY_CARE_PROVIDER_SITE_OTHER): Payer: BC Managed Care – PPO

## 2023-05-24 VITALS — BP 136/87 | HR 80 | Temp 97.3°F | Wt 222.0 lb

## 2023-05-24 DIAGNOSIS — I10 Essential (primary) hypertension: Secondary | ICD-10-CM | POA: Diagnosis not present

## 2023-05-24 NOTE — Progress Notes (Signed)
   Blood Pressure Recheck Visit  Name: Victoria Curry MRN: [id number] Date of Birth: [birth year]  KATHI BERRIER presents today for Blood Pressure recheck with clinical support staff.  Order for BP recheck by Angus Seller, ordered on 05/16/23.   BP Readings from Last 3 Encounters:  05/16/23 (!) 145/93  03/26/23 110/80  03/05/23 (!) 136/97    Current Outpatient Medications  Medication Sig Dispense Refill   acetaminophen (TYLENOL) 650 MG CR tablet Take 650 mg by mouth every 8 (eight) hours as needed for pain.     amLODipine (NORVASC) 10 MG tablet Take 1 tablet by mouth once daily 30 tablet 0   busPIRone (BUSPAR) 15 MG tablet Take 1 tablet (15 mg total) by mouth 2 (two) times daily. 60 tablet 0   cyclobenzaprine (FLEXERIL) 10 MG tablet Take 1 tablet (10 mg total) by mouth 3 (three) times daily as needed for muscle spasms. 30 tablet 0   diazepam (VALIUM) 5 MG tablet Take 5 mg by mouth every 6 (six) hours as needed for anxiety. (Patient not taking: Reported on 05/16/2023)     DULoxetine (CYMBALTA) 30 MG capsule Take 3 capsules (90 mg total) by mouth daily. 90 capsule 0   ferrous fumarate (HEMOCYTE - 106 MG FE) 325 (106 Fe) MG TABS tablet Take 1 tablet (106 mg of iron total) by mouth daily. (Patient not taking: Reported on 01/11/2023) 30 tablet 2   hydrOXYzine (ATARAX) 25 MG tablet Take 1 tablet (25 mg total) by mouth 3 (three) times daily as needed. 90 tablet 0   Inositol Niacinate 500 MG CAPS Take by mouth.     Iron-Vitamins (GERITOL COMPLETE PO) Take by mouth. (Patient not taking: Reported on 05/16/2023)     losartan (COZAAR) 100 MG tablet Take 1 tablet (100 mg total) by mouth daily. 30 tablet 0   methocarbamol (ROBAXIN) 500 MG tablet Take 1 tablet (500 mg total) by mouth 3 (three) times daily. 90 tablet 0   Methylsulfonylmethane (MSM) 1000 MG TABS Take 1,000 mg by mouth daily.     norethindrone-ethinyl estradiol-iron (MICROGESTIN FE 1.5/30) 1.5-30 MG-MCG tablet Take 1 tablet by mouth daily.  (Patient not taking: Reported on 01/11/2023) 30 tablet 2   omeprazole (PRILOSEC) 20 MG capsule Take 20 mg by mouth 2 (two) times daily.     predniSONE (STERAPRED UNI-PAK 21 TAB) 10 MG (21) TBPK tablet Take as directed on the packaging (Patient not taking: Reported on 05/16/2023) 1 each 0   Vitamin D, Ergocalciferol, (DRISDOL) 1.25 MG (50000 UNIT) CAPS capsule Take 1 capsule (50,000 Units total) by mouth every 7 (seven) days. (Patient not taking: Reported on 01/11/2023) 5 capsule 0   No current facility-administered medications for this visit.    Hypertensive Medication Review: Patient states that they are taking all their hypertensive medications as prescribed and their last dose of hypertensive medications was this morning   Documentation of any medication adherence discrepancies: none  Provider Recommendation:  Spoke to [employer]. and they stated: Pzt is to continue medications and follow up in oct.   Patient has been scheduled to follow up with 08/16/23   Patient has been given provider's recommendations and does not have any questions or concerns at this time. Patient will contact the office for any future questions or concerns.

## 2023-06-04 ENCOUNTER — Encounter: Payer: Self-pay | Admitting: Rehabilitative and Restorative Service Providers"

## 2023-06-04 ENCOUNTER — Other Ambulatory Visit: Payer: Self-pay

## 2023-06-04 ENCOUNTER — Ambulatory Visit (INDEPENDENT_AMBULATORY_CARE_PROVIDER_SITE_OTHER): Payer: BC Managed Care – PPO | Admitting: Rehabilitative and Restorative Service Providers"

## 2023-06-04 DIAGNOSIS — M5459 Other low back pain: Secondary | ICD-10-CM

## 2023-06-04 DIAGNOSIS — R262 Difficulty in walking, not elsewhere classified: Secondary | ICD-10-CM

## 2023-06-04 DIAGNOSIS — M797 Fibromyalgia: Secondary | ICD-10-CM | POA: Diagnosis not present

## 2023-06-04 NOTE — Therapy (Signed)
OUTPATIENT PHYSICAL THERAPY EVALUATION   Patient Name: Victoria Curry MRN: [id number] DOB:[birth year], 57 y.o., female Today's Date: 06/04/2023  END OF SESSION:  PT End of Session - 06/04/23 1449     Visit Number 1    Authorization Type [employer] [id number] copay    PT Start Time 1510    PT Stop Time 1557    PT Time Calculation (min) 47 min    Activity Tolerance Patient tolerated treatment well    Behavior During Therapy [facility] for tasks assessed/performed             Past Medical History:  Diagnosis Date   Anemia    Chronic kidney disease (CKD) 12/2022   Depression    Fibromyalgia 11/2002   GERD (gastroesophageal reflux disease)    Hypertension    Vitamin D deficiency 12/2020   Past Surgical History:  Procedure Laterality Date   BREAST LUMPECTOMY  10/1992   Patient Active Problem List   Diagnosis Date Noted   Colon cancer screening 05/17/2023   Chronic midline low back pain without sciatica 03/26/2023   Postmenopausal bleeding 03/05/2023   Rheumatoid factor positive 01/11/2023   Bilateral hand swelling 01/11/2023   Iron deficiency 11/16/2022   Vitamin D deficiency 01/25/2022   Fibroid uterus 08/03/2017   Menorrhagia with regular cycle 08/03/2017   Acute non intractable tension-type headache 12/25/2016   Nausea 12/25/2016   Masses of both breasts 12/29/2015   Absolute anemia 11/13/2015   Essential hypertension 10/09/2015   Gastroesophageal reflux disease without esophagitis 10/09/2015   Metabolic syndrome 10/09/2015   Fibromyalgia 10/09/2015   Depression 10/09/2015    PCP: Ivonne Andrew NP  REFERRING PROVIDER: Juanda Chance, NP  REFERRING DIAG: M54.50 (ICD-10-CM) - Acute bilateral low back pain without sciatica M79.18 (ICD-10-CM) - Myofascial pain syndrome M79.7 (ICD-10-CM) - Fibromyalgia  Rationale for Evaluation and Treatment: Rehabilitation  THERAPY DIAG:  Other low back pain  Difficulty in walking, not elsewhere classified  Fibromyalgia  ONSET  DATE: June 2024 (summer)  SUBJECTIVE:                                                                                                                                                                                           SUBJECTIVE STATEMENT: Pt indicated having complaints of left sided back pain one night after work at [employer].  She woke up with pain complaints and saw MD who prescribed muscle relaxer/rest.  Pt tried ice/heat and OTC medicine that didn't help.  Pt indicated rest and muscle relaxer did help eventually.  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    History of fibromyalgia but not as high levels of pain as reported in these instances.  Wakes up at night due to back pain.   Has reported previously "sciatica" pain in both legs but not there now.    PERTINENT HISTORY:  CKD, Depression, GERD, HTN  PAIN:  NPRS scale: current: 1/10   at worst:  10/10 Pain location: back pain Pain description: debilitating, stiffness/ache Aggravating factors: prolonged sweeping/mopping, walking prolonged/standing prolonged Relieving factors: muscle relaxer/rest  PRECAUTIONS: None  WEIGHT BEARING RESTRICTIONS: No  FALLS:  Has patient fallen in last 6 months? No  LIVING ENVIRONMENT: Lives in: House/apartment 3-4 stairs to enter house.   OCCUPATION: Geologist, engineering.  Work as Copy in summer.   Recently stopped working at [employer] in part due to pain.   PLOF: Independent, walking to school, is a grandma.  Housework.  PATIENT GOALS: Reduce pain  OBJECTIVE:   PATIENT SURVEYS:  None:  pt indicated desire for 1 time visit   SCREENING FOR RED FLAGS: Bowel or bladder incontinence: No Cauda equina syndrome: No  COGNITION: 06/04/2023 Overall cognitive status: WFL normal      SENSATION: 06/04/2023 [facility]  MUSCLE LENGTH: 06/04/2023  POSTURE:  06/04/2023 Mild increase in lumbar lordosis  PALPATION: 06/04/2023 Tenderness with trigger  points bilateral QL and lumbar paraspinals.   LUMBAR ROM:   AROM 06/04/2023  Flexion To mid shin without back pain  Extension 50% WFL   Repeated x 5 in standing : improved to   Right lateral flexion   Left lateral flexion   Right rotation   Left rotation    (Blank rows = not tested)  LOWER EXTREMITY ROM:      Right 06/04/2023 Left 06/04/2023  Hip flexion    Hip extension    Hip abduction    Hip adduction    Hip internal rotation Gross limitation noted in supine Gross limitation noted in supine  Hip external rotation Gross limitation noted in supine Gross limitation noted in supine  Knee flexion    Knee extension    Ankle dorsiflexion    Ankle plantarflexion    Ankle inversion    Ankle eversion     (Blank rows = not tested)  LOWER EXTREMITY MMT:    MMT Right 06/04/2023 Left 06/04/2023  Hip flexion 5/5/ 5/5  Hip extension    Hip abduction    Hip adduction    Hip internal rotation    Hip external rotation    Knee flexion 5/5 5/5  Knee extension 5/5 5/5  Ankle dorsiflexion 5/5 5/5  Ankle plantarflexion    Ankle inversion    Ankle eversion     (Blank rows = not tested)  LUMBAR SPECIAL TESTS:  06/04/2023 -slump bilateral  FUNCTIONAL TESTS:  06/04/2023 Able to perform sit to stand from 18 inch chair s UE assist.   GAIT: 06/04/2023 Independent ambulation  TODAY'S TREATMENT:                                                                                                         DATE:  06/04/2023  Therex:    HEP instruction/performance c cues for techniques, handout provided.  Trial set performed of each for comprehension and symptom assessment.  See below for exercise list  PATIENT EDUCATION:  06/04/2023 Education details: HEP, POC, DN paper Person educated:  Patient Education method: Explanation, Demonstration, Verbal cues, and Handouts Education comprehension: verbalized understanding, returned demonstration, and verbal cues required  HOME EXERCISE PROGRAM: Access Code: [email or web address] URL: [email or web address] Date: 06/04/2023 Prepared by: Chyrel Masson  Exercises - Standing Lumbar Extension with Counter  - 3-5 x daily - 7 x weekly - 1 sets - 5 reps - Supine Lower Trunk Rotation  - 2-3 x daily - 7 x weekly - 1 sets - 3-5 reps - 15 hold - Supine Bridge  - 1-2 x daily - 7 x weekly - 1-2 sets - 10 reps - 2 hold - Supine Figure 4 Piriformis Stretch  - 2 x daily - 7 x weekly - 1 sets - 5 reps - 15 hold - Supine Piriformis Stretch with Foot on Ground  - 2 x daily - 7 x weekly - 1 sets - 5 reps - 15 hold - Hooklying Gluteal Sets  - 2 x daily - 7 x weekly - 1 sets - 10 reps - 5 hold  ASSESSMENT:  CLINICAL IMPRESSION: Patient is a 57 y.o. who comes to clinic with complaints of back pain with mobility, strength and movement coordination deficits that impair their ability to perform usual daily and recreational functional activities without increase difficulty/symptoms at this time.  Patient to benefit from skilled PT services to address impairments and limitations to improve to previous level of function without restriction secondary to condition.   OBJECTIVE IMPAIRMENTS: Abnormal gait, decreased activity tolerance, decreased mobility, difficulty walking, decreased ROM, hypomobility, increased fascial restrictions, impaired perceived functional ability, impaired flexibility, improper body mechanics, postural dysfunction, and pain.   ACTIVITY LIMITATIONS: carrying, lifting, bending, sitting, standing, squatting, sleeping, stairs, transfers, bed mobility, reach over head, locomotion level, and caring for others  PARTICIPATION LIMITATIONS: meal prep, cleaning, laundry, interpersonal relationship, driving, shopping, community activity,  occupation, and yard work  PERSONAL FACTORS:  CKD, Depression, GERD, HTN  are also affecting patient's functional outcome.   REHAB POTENTIAL: Good  CLINICAL DECISION MAKING: Stable/uncomplicated  EVALUATION COMPLEXITY: Low   GOALS: Goals reviewed with patient? Yes  SHORT TERM GOALS: (target date for Short term goals are 3 weeks 06/25/2023)  1. Patient will demonstrate independent use of home exercise program to maintain progress from in clinic treatments.  Goal status: New  LONG TERM GOALS: (target dates for all long term goals are 10 weeks  08/13/2023 )   1. Patient will demonstrate/report pain at worst less than or equal to 2/10 to facilitate minimal limitation in daily activity secondary to pain symptoms.  Goal status: New   2. Patient will demonstrate independent use of home exercise  program to facilitate ability to maintain/progress functional gains from skilled physical therapy services.  Goal status: New   3. Patient will demonstrate/report ability to perform work activity s restriction due to symptoms.   Goal status: New   4. Patient will demonstrate lumbar extension 100 % WFL s symptoms to facilitate upright standing, walking posture at PLOF s limitation.  Goal status: New   5.  Patient will demonstrate supine to sit to supine with log rolling techniques s symptoms.   Goal status: New    PLAN:  PT FREQUENCY: 1-2x/week (Pt may be limited by cost/transportation)  PT DURATION: 10 weeks  PLANNED INTERVENTIONS: Therapeutic exercises, Therapeutic activity, Neuro Muscular re-education, Balance training, Gait training, Patient/Family education, Joint mobilization, Stair training, DME instructions, Dry Needling, Electrical stimulation, Cryotherapy, vasopneumatic device, Moist heat, Taping, Traction Ultrasound, Ionotophoresis 4mg /ml Dexamethasone, and aquatic therapy, Manual therapy.  All included unless contraindicated  PLAN FOR NEXT SESSION: Review HEP  knowledge/results.   Possible DN for QL/paraspinals.    Chyrel Masson, PT, DPT, OCS, ATC 06/04/23  4:20 PM

## 2023-06-15 ENCOUNTER — Encounter (HOSPITAL_COMMUNITY): Payer: Self-pay | Admitting: Clinical

## 2023-06-15 ENCOUNTER — Encounter (HOSPITAL_COMMUNITY): Payer: Self-pay

## 2023-06-15 ENCOUNTER — Ambulatory Visit (INDEPENDENT_AMBULATORY_CARE_PROVIDER_SITE_OTHER): Payer: BC Managed Care – PPO | Admitting: Clinical

## 2023-06-15 DIAGNOSIS — F332 Major depressive disorder, recurrent severe without psychotic features: Secondary | ICD-10-CM
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
## 2023-06-27 ENCOUNTER — Encounter: Payer: BC Managed Care – PPO | Admitting: Rehabilitative and Restorative Service Providers"

## 2023-07-07 ENCOUNTER — Other Ambulatory Visit: Payer: Self-pay | Admitting: Nurse Practitioner

## 2023-07-07 DIAGNOSIS — I10 Essential (primary) hypertension: Secondary | ICD-10-CM

## 2023-07-07 DIAGNOSIS — F419 Anxiety disorder, unspecified: Secondary | ICD-10-CM

## 2023-07-07 DIAGNOSIS — E559 Vitamin D deficiency, unspecified: Secondary | ICD-10-CM

## 2023-07-12 ENCOUNTER — Other Ambulatory Visit: Payer: Self-pay | Admitting: Physical Medicine and Rehabilitation

## 2023-07-12 MED ORDER — METHOCARBAMOL 500 MG PO TABS: 500.0000 mg | ORAL_TABLET | Freq: Three times a day (TID) | ORAL | 0 refills | Status: DC

## 2023-07-13 ENCOUNTER — Other Ambulatory Visit: Payer: Self-pay | Admitting: Nurse Practitioner

## 2023-07-13 DIAGNOSIS — Z0000 Encounter for general adult medical examination without abnormal findings: Secondary | ICD-10-CM

## 2023-07-13 DIAGNOSIS — F419 Anxiety disorder, unspecified: Secondary | ICD-10-CM

## 2023-07-13 DIAGNOSIS — I10 Essential (primary) hypertension: Secondary | ICD-10-CM

## 2023-07-13 DIAGNOSIS — E559 Vitamin D deficiency, unspecified: Secondary | ICD-10-CM

## 2023-07-13 DIAGNOSIS — F329 Major depressive disorder, single episode, unspecified: Secondary | ICD-10-CM

## 2023-07-13 MED ORDER — AMLODIPINE BESYLATE 10 MG PO TABS
10.0000 mg | ORAL_TABLET | Freq: Every day | ORAL | 0 refills | Status: DC
Start: 2023-07-13 — End: 2023-08-24

## 2023-07-13 MED ORDER — BUSPIRONE HCL 15 MG PO TABS
15.0000 mg | ORAL_TABLET | Freq: Two times a day (BID) | ORAL | 0 refills | Status: DC
Start: 2023-07-13 — End: 2023-08-24

## 2023-07-13 MED ORDER — DULOXETINE HCL 30 MG PO CPEP
90.0000 mg | ORAL_CAPSULE | Freq: Every day | ORAL | 0 refills | Status: DC
Start: 2023-07-13 — End: 2023-11-23

## 2023-07-13 MED ORDER — LOSARTAN POTASSIUM 100 MG PO TABS
100.0000 mg | ORAL_TABLET | Freq: Every day | ORAL | 0 refills | Status: DC
Start: 2023-07-13 — End: 2023-08-24

## 2023-07-13 MED ORDER — VITAMIN D (ERGOCALCIFEROL) 1.25 MG (50000 UNIT) PO CAPS
50000.0000 [IU] | ORAL_CAPSULE | ORAL | 0 refills | Status: AC
Start: 2023-07-13 — End: ?

## 2023-08-02 ENCOUNTER — Ambulatory Visit (HOSPITAL_COMMUNITY): Payer: Self-pay | Admitting: Clinical

## 2023-08-16 ENCOUNTER — Ambulatory Visit: Payer: Self-pay | Admitting: Nurse Practitioner

## 2023-08-16 ENCOUNTER — Encounter (HOSPITAL_COMMUNITY): Payer: Self-pay | Admitting: Clinical

## 2023-08-16 ENCOUNTER — Ambulatory Visit (HOSPITAL_COMMUNITY): Payer: BC Managed Care – PPO | Admitting: Clinical

## 2023-08-16 DIAGNOSIS — F332 Major depressive disorder, recurrent severe without psychotic features: Secondary | ICD-10-CM | POA: Diagnosis not present

## 2023-08-16 NOTE — Progress Notes (Unsigned)
THERAPIST PROGRESS NOTE  Session Time: 4:03-5:03pm  Session #2  Virtual Visit via Video Note  I connected with Victoria Curry on 08/16/23 at  4:00 PM EDT by a video enabled telemedicine application and verified that I am speaking with the correct person using two identifiers.  Location: Patient: home Provider: outpatient therapy office   I discussed the limitations of evaluation and management by telemedicine and the availability of in person appointments. The patient expressed understanding and agreed to proceed.   I discussed the assessment and treatment plan with the patient. The patient was provided an opportunity to ask questions and all were answered. The patient agreed with the plan and demonstrated an understanding of the instructions.   The patient was advised to call back or seek an in-person evaluation if the symptoms worsen or if the condition fails to improve as anticipated.  I provided 60 minutes of non-face-to-face time during this encounter.  Victoria Chad, LCSW    Participation Level: Active  Behavioral Response: Casual Alert Anxious and Depressed  Type of Therapy: Individual Therapy  Treatment Goals addressed: *** LTG: Victoria Curry will score less than 5 on the Generalized Anxiety Disorder 7 Scale (GAD-7) STG: Victoria Curry will participate in at least 80% of scheduled individual psychotherapy sessions STG: Victoria Curry will practice problem solving skills 3 times per week for the next 4 weeks. STG: Victoria Curry will reduce frequency of avoidant behaviors by 50% as evidenced by self-report in therapy sessions LTG: Increase coping skills to manage depression and improve ability to perform daily activities LTG: Victoria Curry will score less than 9 on the Patient Health Questionnaire (PHQ-9) STG: Victoria Curry will identify cognitive patterns and beliefs that support depression STG: Victoria Curry will practice behavioral activation skills 1-2 times per week for the next 12 weeks LTG: Develop  and implement effective coping skills to carry out normal responsibilities and participate constructively in relationships as evidenced by self-report LTG: Recall traumatic events without becoming overwhelmed with negative emotions STG: Victoria Curry will cooperate with a psychiatric evaluation as needed  and during all follow-up visits STG: Victoria Curry will acknowledge that healing from PTSD is a gradual process STG: Victoria Curry will identify negative coping strategies that have been used to cope with the feelings associated with the trauma STG: Victoria Curry will identify coping strategies to deal with trauma memories and the associated emotional reaction STG: Victoria Curry will verbalize an increased sense of mastery over PTSD symptoms by using several techniques to cope with flashbacks, decrease the power of triggers, and decrease negative thinking STG: Victoria Curry will follow a medication regimen as recommended by the provider and report any side effects that are experienced  ProgressTowards Goals: Progressing  Interventions: CBT, Supportive, and Other: overview of needs  Summary: Victoria Curry is a 57 y.o. female who presents with ***.   Suicidal/Homicidal: No without intent/plan  Therapist Response: Patient is progressing AEB engaging in scheduled therapy session.  She presented oriented x5 and stated she was feeling "***."  CSW evaluated patient's medication compliance, use of coping tools, and self-care, as applicable.  Patient stated ***.  CSW taught ***.  Patient received this willingly and indicated *** comprehension.  CSW assigned patient homework of ***.  CSW encouraged patient to schedule more therapy sessions for the future, as there are only 2 remaining.  Throughout the session, CSW gave patient the opportunity to explore thoughts and feelings associated with current life situations and past/present stressors.   CSW challenged patient gently and appropriately to consider different ways of looking at  reported  issues. CSW encouraged patient's expression of feelings and validated these using empathy, active listening, open body language, and unconditional positive regard.      Recommendations:  Return to therapy in 2 weeks, engage in self care behaviors, try to use Scheduling Worry Time and control/don't control list as taught in session, call in to make more appointments  Plan: Return again in 2 weeks.  Diagnosis:  Major depressive disorder, recurrent episode, severe with anxious distress (HCC)  Collaboration of Care: Primary Care Provider AEB - can see that patient is in therapy  Patient/Guardian was advised Release of Information must be obtained prior to any record release in order to collaborate their care with an outside provider. Patient/Guardian was advised if they have not already done so to contact the registration department to sign all necessary forms in order for [place] to release information regarding their care.   Consent: Patient/Guardian gives verbal consent for treatment and assignment of benefits for services provided during this visit. Patient/Guardian expressed understanding and agreed to proceed.   Victoria Chad, LCSW 08/16/2023

## 2023-08-24 ENCOUNTER — Ambulatory Visit: Payer: BC Managed Care – PPO | Admitting: Nurse Practitioner

## 2023-08-24 ENCOUNTER — Encounter: Payer: Self-pay | Admitting: Nurse Practitioner

## 2023-08-24 VITALS — BP 135/92 | HR 102 | Resp 16 | Ht 64.0 in | Wt 224.5 lb

## 2023-08-24 DIAGNOSIS — I10 Essential (primary) hypertension: Secondary | ICD-10-CM

## 2023-08-24 DIAGNOSIS — M25511 Pain in right shoulder: Secondary | ICD-10-CM

## 2023-08-24 DIAGNOSIS — F419 Anxiety disorder, unspecified: Secondary | ICD-10-CM

## 2023-08-24 MED ORDER — BUSPIRONE HCL 30 MG PO TABS
30.0000 mg | ORAL_TABLET | Freq: Two times a day (BID) | ORAL | 2 refills | Status: DC
Start: 2023-08-24 — End: 2023-11-23

## 2023-08-24 MED ORDER — KETOROLAC TROMETHAMINE 30 MG/ML IJ SOLN
30.0000 mg | Freq: Once | INTRAMUSCULAR | Status: AC
Start: 2023-08-24 — End: 2023-08-24
  Administered 2023-08-24: 30 mg via INTRAMUSCULAR

## 2023-08-24 MED ORDER — AMLODIPINE BESYLATE 10 MG PO TABS
10.0000 mg | ORAL_TABLET | Freq: Every day | ORAL | 0 refills | Status: DC
Start: 2023-08-24 — End: 2023-11-23

## 2023-08-24 MED ORDER — KETOROLAC TROMETHAMINE 60 MG/2ML IM SOLN
60.0000 mg | Freq: Once | INTRAMUSCULAR | Status: DC
Start: 2023-08-24 — End: 2023-08-24

## 2023-08-24 MED ORDER — LOSARTAN POTASSIUM 100 MG PO TABS
100.0000 mg | ORAL_TABLET | Freq: Every day | ORAL | 0 refills | Status: DC
Start: 2023-08-24 — End: 2023-11-23

## 2023-08-24 NOTE — Progress Notes (Unsigned)
Subjective   Patient ID: Victoria Curry, female    DOB: Feb 16, 1966, 57 y.o.   MRN: [id number]  Chief Complaint  Patient presents with   Medical Management of Chronic Issues    Has not had BP med this week     Referring provider: Ivonne Andrew, NP  Victoria Curry is a 57 y.o. female with Past Medical History: No date: Anemia 12/2022: Chronic kidney disease (CKD) No date: Depression 11/2002: Fibromyalgia No date: GERD (gastroesophageal reflux disease) No date: Hypertension 12/2020: Vitamin D deficiency   HPI  Right shoulder pain        Allergies  Allergen Reactions   Pineapple Swelling   Adhesive [Tape] Other (See Comments)    Skin irritation    Codeine Itching   Latex Other (See Comments)    Skin irritation    Shellfish Allergy     Immunization History  Administered Date(s) Administered   Hepatitis B, ADULT 08/10/2017, 09/11/2017   Influenza,inj,Quad PF,6+ Mos 11/11/2015, 08/11/2016, 08/10/2017, 07/02/2019   Influenza-Unspecified 07/07/2023   PFIZER(Purple Top)SARS-COV-2 Vaccination 12/29/2019, 02/25/2020   Tdap 08/11/2016   Unspecified SARS-COV-2 Vaccination 07/07/2023    Tobacco History: Social History   Tobacco Use  Smoking Status Never   Passive exposure: Past  Smokeless Tobacco Never   Counseling given: Not Answered   Outpatient Encounter Medications as of 08/24/2023  Medication Sig   acetaminophen (TYLENOL) 650 MG CR tablet Take 650 mg by mouth every 8 (eight) hours as needed for pain.   busPIRone (BUSPAR) 30 MG tablet Take 1 tablet (30 mg total) by mouth in the morning and at bedtime.   cyclobenzaprine (FLEXERIL) 10 MG tablet Take 1 tablet (10 mg total) by mouth 3 (three) times daily as needed for muscle spasms.   diazepam (VALIUM) 5 MG tablet Take 5 mg by mouth every 6 (six) hours as needed for anxiety.   DULoxetine (CYMBALTA) 30 MG capsule Take 3 capsules (90 mg total) by mouth daily.   ferrous fumarate (HEMOCYTE - 106 MG FE) 325  (106 Fe) MG TABS tablet Take 1 tablet (106 mg of iron total) by mouth daily.   hydrOXYzine (ATARAX) 25 MG tablet Take 1 tablet (25 mg total) by mouth 3 (three) times daily as needed.   Inositol Niacinate 500 MG CAPS Take by mouth.   Iron-Vitamins (GERITOL COMPLETE PO) Take by mouth.   methocarbamol (ROBAXIN) 500 MG tablet Take 1 tablet (500 mg total) by mouth 3 (three) times daily.   Methylsulfonylmethane (MSM) 1000 MG TABS Take 1,000 mg by mouth daily.   norethindrone-ethinyl estradiol-iron (MICROGESTIN FE 1.5/30) 1.5-30 MG-MCG tablet Take 1 tablet by mouth daily.   omeprazole (PRILOSEC) 20 MG capsule Take 20 mg by mouth 2 (two) times daily.   predniSONE (STERAPRED UNI-PAK 21 TAB) 10 MG (21) TBPK tablet Take as directed on the packaging   Vitamin D, Ergocalciferol, (DRISDOL) 1.25 MG (50000 UNIT) CAPS capsule Take 1 capsule (50,000 Units total) by mouth once a week.   [DISCONTINUED] amLODipine (NORVASC) 10 MG tablet Take 1 tablet (10 mg total) by mouth daily.   [DISCONTINUED] busPIRone (BUSPAR) 15 MG tablet Take 1 tablet (15 mg total) by mouth 2 (two) times daily.   [DISCONTINUED] losartan (COZAAR) 100 MG tablet Take 1 tablet (100 mg total) by mouth daily.   amLODipine (NORVASC) 10 MG tablet Take 1 tablet (10 mg total) by mouth daily.   losartan (COZAAR) 100 MG tablet Take 1 tablet (100 mg total) by mouth daily.   Facility-Administered  Encounter Medications as of 08/24/2023  Medication   ketorolac (TORADOL) injection 60 mg    Review of Systems  Review of Systems  Constitutional: Negative.   HENT: Negative.    Cardiovascular: Negative.   Gastrointestinal: Negative.   Allergic/Immunologic: Negative.   Neurological: Negative.   Psychiatric/Behavioral: Negative.       Objective:   BP (!) 135/92   Pulse (!) 102   Resp 16   Ht 5\' 4"  (1.626 m)   Wt 224 lb 8 oz (101.8 kg)   SpO2 98%   BMI 38.54 kg/m   Wt Readings from Last 5 Encounters:  08/24/23 224 lb 8 oz (101.8 kg)  05/24/23  222 lb (100.7 kg)  05/16/23 223 lb (101.2 kg)  03/26/23 209 lb 9.6 oz (95.1 kg)  03/05/23 208 lb (94.3 kg)     Physical Exam Vitals and nursing note reviewed.  Constitutional:      General: She is not in acute distress.    Appearance: She is well-developed.  Cardiovascular:     Rate and Rhythm: Normal rate and regular rhythm.  Pulmonary:     Effort: Pulmonary effort is normal.     Breath sounds: Normal breath sounds.  Neurological:     Mental Status: She is alert and oriented to person, place, and time.     {Labs (Optional):23779}  Assessment & Plan:   Anxiety -     busPIRone HCl; Take 1 tablet (30 mg total) by mouth in the morning and at bedtime.  Dispense: 60 tablet; Refill: 2  Essential hypertension -     amLODIPine Besylate; Take 1 tablet (10 mg total) by mouth daily.  Dispense: 30 tablet; Refill: 0 -     Losartan Potassium; Take 1 tablet (100 mg total) by mouth daily.  Dispense: 30 tablet; Refill: 0  Acute pain of right shoulder -     Ketorolac Tromethamine     Return in about 3 months (around 11/24/2023).   Ivonne Andrew, NP 08/24/2023

## 2023-08-24 NOTE — Patient Instructions (Addendum)
1. Essential hypertension  - amLODipine (NORVASC) 10 MG tablet; Take 1 tablet (10 mg total) by mouth daily.  Dispense: 30 tablet; Refill: 0 - losartan (COZAAR) 100 MG tablet; Take 1 tablet (100 mg total) by mouth daily.  Dispense: 30 tablet; Refill: 0   2. Anxiety  - busPIRone (BUSPAR) 30 MG tablet; Take 1 tablet (30 mg total) by mouth in the morning and at bedtime.  Dispense: 60 tablet; Refill: 2   3. Acute pain of right shoulder  - ketorolac (TORADOL) injection 60 mg    Follow up:  Follow up in 3 months    Food Basics for Chronic Kidney Disease Chronic kidney disease (CKD) is when your kidneys are not working well. They cannot remove waste, fluids, and other substances from your blood the way they should. These substances can build up, which can worsen kidney damage and affect how your body works. Eating certain foods can lead to a buildup of these substances. Changing your diet can help prevent more kidney damage. Diet changes may also delay dialysis or even keep you from needing it. What nutrients should I limit? Work with your treatment team and a food expert (dietitian) to make a meal plan that's right for you. Foods you can eat and foods you should limit or avoid will depend on the stage of your kidney disease and any other health conditions you have. The items listed below are not a complete list. Talk with your dietitian to learn what is best for you. Potassium Potassium affects how steadily your heart beats. Too much potassium in your blood can cause an irregular heartbeat or even a heart attack. You may need to limit foods that are high in potassium, such as: Liquid milk and soy milk. Salt substitutes that contain potassium. Fruits like bananas, apricots, nectarines, melon, prunes, raisins, kiwi, and oranges. Vegetables, such as potatoes, sweet potatoes, yams, tomatoes, leafy greens, beets, avocado, pumpkin, and winter squash. Beans, like lima  beans. Nuts. Phosphorus Phosphorus is a mineral found in your bones. You need a balance between calcium and phosphorus to build and maintain healthy bones. Too much added phosphorus from the foods you eat can pull calcium from your bones. Losing calcium can make your bones weak and more likely to break. Too much phosphorus can also make your skin itch. You may need to limit foods that are high in phosphorus or that have added phosphorus, such as: Liquid milk and dairy products. Dark-colored sodas or soft drinks. Bran cereals and oatmeal. Protein  Protein helps you make and keep muscle. Protein also helps to repair your body's cells and tissues. One of the natural breakdown products of protein is a waste product called urea. When your kidneys are not working well, they cannot remove types of waste like urea. Reducing protein in your diet can help keep urea from building up in your blood. Depending on your stage of kidney disease, you may need to eat smaller portions of foods that are high in protein. Sources of animal protein include: Meat (all types). Fish and seafood. Poultry. Eggs. Dairy. Other protein foods include: Beans and legumes. Nuts and nut butter. Soy, like tofu.  Sodium Salt (sodium) helps to keep a healthy balance of fluids in your body. Too much salt can increase your blood pressure, which can harm your heart and lungs. Extra salt can also cause your body to keep too much fluid, making your kidneys work harder. You may need to limit or avoid foods that are high  in salt, such as: Salt seasonings. Soy and teriyaki sauce. Packaged, precooked, cured, or processed meats, such as sausages or meat loaves. Sardines. Salted crackers and snack foods. Fast food. Canned soups and most canned foods. Pickled foods. Vegetable juice. Boxed mixes or ready-to-eat boxed meals and side dishes. Bottled dressings, sauces, and marinades. Talk with your dietitian about how much potassium,  phosphorus, protein, and salt you may have each day. Helpful tips Read food labels  Check the amount of salt in foods. Limit foods that have salt or sodium listed among the first five ingredients. Try to eat low-salt foods. Check the ingredient list for added phosphorus or potassium. "Phos" in an ingredient is a sign that phosphorus has been added. Do not buy foods that are calcium-enriched or that have calcium added to them (are fortified). Buy canned vegetables and beans that say "no salt added" and rinse them before eating. Lifestyle Limit the amount of protein you eat from animal sources each day. Focus on protein from plant sources, like tofu and dried beans, peas, and lentils. Do not add salt to food when cooking or before eating. Do not eat star fruit. It can be toxic for people with kidney problems. Talk with your health care provider before taking any vitamin or mineral supplements. If told by your health care provider, track how much liquid you drink so you can avoid drinking too much. You may need to include foods you eat that are made mostly from water, like gelatin, ice cream, soups, and juicy fruits and vegetables. If you have diabetes: If you have diabetes (diabetes mellitus) and CKD, you need to keep your blood sugar (glucose) in the target range recommended by your health care provider. Follow your diabetes management plan. This may include: Checking your blood glucose regularly. Taking medicines by mouth, or taking insulin, or both. Exercising for at least 30 minutes on 5 or more days each week, or as told by your health care provider. Tracking how many servings of carbohydrates you eat at each meal. Not using orange juice to treat low blood sugars. Instead, use apple juice, cranberry juice, or clear soda. You may be given guidelines on what foods and nutrients you may eat, and how much you can have each day. This depends on your stage of kidney disease and whether you have  high blood pressure (hypertension). Follow the meal plan your dietitian gives you. To learn more: [employer] of Diabetes and Digestive and Kidney Diseases: [employer]: kidney.org Summary Chronic kidney disease (CKD) is when your kidneys are not working well. They cannot remove waste, fluids, and other substances from your blood the way they should. These substances can build up, which can worsen kidney damage and affect how your body works. Changing your diet can help prevent more kidney damage. Diet changes may also delay dialysis or even keep you from needing it. Diet changes are different for each person with CKD. Work with a dietitian to set up a meal plan that is right for you. This information is not intended to replace advice given to you by your health care provider. Make sure you discuss any questions you have with your health care provider. Document Revised: 01/26/2022 Document Reviewed: 02/02/2020 Elsevier Patient Education  2024 [employer].

## 2023-08-30 ENCOUNTER — Encounter (HOSPITAL_COMMUNITY): Payer: Self-pay | Admitting: Clinical

## 2023-08-30 ENCOUNTER — Ambulatory Visit (INDEPENDENT_AMBULATORY_CARE_PROVIDER_SITE_OTHER): Payer: BC Managed Care – PPO | Admitting: Clinical

## 2023-08-30 DIAGNOSIS — F419 Anxiety disorder, unspecified: Secondary | ICD-10-CM | POA: Diagnosis not present

## 2023-08-30 DIAGNOSIS — F332 Major depressive disorder, recurrent severe without psychotic features: Secondary | ICD-10-CM | POA: Diagnosis not present

## 2023-08-30 NOTE — Progress Notes (Signed)
THERAPIST PROGRESS NOTE  Session Time: 4:00-5:00pm  Session #3  Virtual Visit via Video Note  I connected with [name] on 08/30/23 at  4:00 PM EST by a video enabled telemedicine application and verified that I am speaking with the correct person using two identifiers.  Location: Patient: home Provider: outpatient therapy office   I discussed the limitations of evaluation and management by telemedicine and the availability of in person appointments. The patient expressed understanding and agreed to proceed.   I discussed the assessment and treatment plan with the patient. The patient was provided an opportunity to ask questions and all were answered. The patient agreed with the plan and demonstrated an understanding of the instructions.   The patient was advised to call back or seek an in-person evaluation if the symptoms worsen or if the condition fails to improve as anticipated.  I provided 60 minutes of non-face-to-face time during this encounter.  Lynnell Chad, LCSW    Participation Level: Active  Behavioral Response: Casual Alert Anxious and Depressed  Type of Therapy: Individual Therapy  Treatment Goals addressed:  LTG: Lily-Rose will score less than 5 on the Generalized Anxiety Disorder 7 Scale (GAD-7) STG: Bozena will participate in at least 80% of scheduled individual psychotherapy sessions STG: Carlos will practice problem solving skills 3 times per week for the next 4 weeks. STG: Shannae will reduce frequency of avoidant behaviors by 50% as evidenced by self-report in therapy sessions LTG: Increase coping skills to manage depression and improve ability to perform daily activities LTG: Jasmia will score less than 9 on the Patient Health Questionnaire (PHQ-9) STG: Robina will identify cognitive patterns and beliefs that support depression STG: Jayle will practice behavioral activation skills 1-2 times per week for the next 12 weeks LTG: Develop and  implement effective coping skills to carry out normal responsibilities and participate constructively in relationships as evidenced by self-report LTG: Recall traumatic events without becoming overwhelmed with negative emotions STG: Aribel will cooperate with a psychiatric evaluation as needed  and during all follow-up visits STG: Lennix will acknowledge that healing from PTSD is a gradual process STG: Alvine will identify negative coping strategies that have been used to cope with the feelings associated with the trauma STG: Corisa will identify coping strategies to deal with trauma memories and the associated emotional reaction STG: Zavanna will verbalize an increased sense of mastery over PTSD symptoms by using several techniques to cope with flashbacks, decrease the power of triggers, and decrease negative thinking STG: Kiaura will follow a medication regimen as recommended by the provider and report any side effects that are experienced  ProgressTowards Goals: Progressing  Interventions: CBT, Strength-based, and Supportive  Summary: LIRON LAGESSE is a 57 y.o. female who presents with anxiety and depression.  She presented oriented x5 and stated she was feeling "like I'm fat and unlovable."  CSW evaluated patient's medication compliance, use of coping tools, and self-care, as applicable.  CSW asked her what name she prefers to use and she immediately reacted to the name "Angie" stating that her father used to call her that and it evoked bad feelings.  This led to her recounting that she decided to write a letter to her father, in which she told him the various grievances she has against him and what it has done to her life and "4 generations of women."   She "felt a weight lift" by doing this.  She told him not to write back but suspects he will  anyway, so we discussed possible reactions she could have including writing "Return to Sender" on the envelope and giving to [employer].  Even though she  stated she felt better after the letter, she did spend much of the session in venting about additional harms he has done.  Through witnessing her son's excellent relationship with his daughter, she has realized what she missed as a child with her own father.  CSW provided teaching about the [employer] concept of parenting your own inner child, choosing to be the "gentle parent" to yourself to replace the harsh critic and judge that you grew up with.  She talked about her size, stating she now weighs 225 pounds and how that impacts her relationship with on-and-off boyfriend (who will be coming to visit in 2 weeks).  She asked a variety of questions and made a number of statements that were very self-deprecating, so CSW pointed out that she is essentially abusing herself.  All of these things were discussed in the context of CBT, with CSW taking her through the process of challenging some of her negative thoughts.  This was only partially successful because she continues to state, "But I still feel____."  She was sent the "How to challenge negative thoughts" handout.  CSW mentioned numerous times that how she talks to herself is very important and that she will eventually believe those negative things.  Affirmations was mentioned as a countermeasure, but she stated she does not believe in affirmations.  It was ascertained through our discussion that she does not believe in the [place], and CSW took time to explain to her the differences.  Suicidal/Homicidal: No without intent/plan  Therapist Response: Patient is progressing AEB engaging in scheduled therapy session.  CSW encouraged patient to schedule more therapy sessions for the future, as there is only one remaining currently.  Throughout the session, CSW gave patient the opportunity to explore thoughts and feelings associated with current life situations and past/present stressors.   CSW challenged patient gently and appropriately to consider different  ways of looking at reported issues. CSW encouraged patient's expression of feelings and validated these using empathy, active listening, open body language, and unconditional positive regard.     Recommendations:  Return to therapy in 2 weeks, start testing her thoughts as shown in CBT handout sent to her, start developing positive affirmations for herself, make more appointments  Plan: Return again in 2 weeks on 11/21  Diagnosis:  Major depressive disorder, recurrent episode, severe with anxious distress (HCC)  Collaboration of Care: Primary Care Provider AEB - can see that patient is in therapy  Patient/Guardian was advised Release of Information must be obtained prior to any record release in order to collaborate their care with an outside provider. Patient/Guardian was advised if they have not already done so to contact the registration department to sign all necessary forms in order for [place] to release information regarding their care.   Consent: Patient/Guardian gives verbal consent for treatment and assignment of benefits for services provided during this visit. Patient/Guardian expressed understanding and agreed to proceed.   Lynnell Chad, LCSW 08/30/2023

## 2023-09-13 ENCOUNTER — Ambulatory Visit (INDEPENDENT_AMBULATORY_CARE_PROVIDER_SITE_OTHER): Payer: Self-pay | Admitting: Clinical

## 2023-09-13 ENCOUNTER — Encounter (HOSPITAL_COMMUNITY): Payer: Self-pay | Admitting: Clinical

## 2023-09-13 DIAGNOSIS — F332 Major depressive disorder, recurrent severe without psychotic features: Secondary | ICD-10-CM
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
## 2023-11-15 ENCOUNTER — Ambulatory Visit: Payer: BC Managed Care – PPO | Admitting: Dermatology

## 2023-11-20 ENCOUNTER — Ambulatory Visit: Payer: 59 | Admitting: Dermatology

## 2023-11-23 ENCOUNTER — Encounter: Payer: Self-pay | Admitting: Nurse Practitioner

## 2023-11-23 ENCOUNTER — Ambulatory Visit (INDEPENDENT_AMBULATORY_CARE_PROVIDER_SITE_OTHER): Payer: 59 | Admitting: Nurse Practitioner

## 2023-11-23 VITALS — BP 129/95 | HR 78 | Temp 97.5°F | Wt 225.0 lb

## 2023-11-23 DIAGNOSIS — F419 Anxiety disorder, unspecified: Secondary | ICD-10-CM

## 2023-11-23 DIAGNOSIS — E559 Vitamin D deficiency, unspecified: Secondary | ICD-10-CM

## 2023-11-23 DIAGNOSIS — Z13220 Encounter for screening for lipoid disorders: Secondary | ICD-10-CM

## 2023-11-23 DIAGNOSIS — Z0000 Encounter for general adult medical examination without abnormal findings: Secondary | ICD-10-CM

## 2023-11-23 DIAGNOSIS — F329 Major depressive disorder, single episode, unspecified: Secondary | ICD-10-CM | POA: Diagnosis not present

## 2023-11-23 DIAGNOSIS — I10 Essential (primary) hypertension: Secondary | ICD-10-CM

## 2023-11-23 MED ORDER — DULOXETINE HCL 30 MG PO CPEP
90.0000 mg | ORAL_CAPSULE | Freq: Every day | ORAL | 0 refills | Status: DC
Start: 2023-11-23 — End: 2024-01-16

## 2023-11-23 MED ORDER — BUSPIRONE HCL 30 MG PO TABS
30.0000 mg | ORAL_TABLET | Freq: Two times a day (BID) | ORAL | 2 refills | Status: DC
Start: 2023-11-23 — End: 2024-05-22

## 2023-11-23 MED ORDER — AMLODIPINE BESYLATE 10 MG PO TABS
10.0000 mg | ORAL_TABLET | Freq: Every day | ORAL | 0 refills | Status: DC
Start: 2023-11-23 — End: 2024-01-16

## 2023-11-23 MED ORDER — LOSARTAN POTASSIUM 100 MG PO TABS
100.0000 mg | ORAL_TABLET | Freq: Every day | ORAL | 0 refills | Status: DC
Start: 2023-11-23 — End: 2024-01-16

## 2023-11-23 MED ORDER — HYDROXYZINE HCL 25 MG PO TABS
25.0000 mg | ORAL_TABLET | Freq: Three times a day (TID) | ORAL | 0 refills | Status: DC | PRN
Start: 2023-11-23 — End: 2024-08-18

## 2023-11-23 NOTE — Progress Notes (Signed)
 Subjective   Patient ID: Victoria Curry, female    DOB: 05/19/1966, 58 y.o.   MRN: [id number]  Chief Complaint  Patient presents with   Medical Management of Chronic Issues    Referring provider: Ivonne Andrew, NP  Victoria Curry is a 58 y.o. female with Past Medical History: No date: Anemia 12/2022: Chronic kidney disease (CKD) No date: Depression 11/2002: Fibromyalgia No date: GERD (gastroesophageal reflux disease) No date: Hypertension 12/2020: Vitamin D deficiency   HPI  Patient presents today for follow-up on hypertension. She states that she has been out of her medications this past week. We will refill amlodipine and Cozaar today. Did have PT for back and shoulder pain. Taking muscle relaxer. Has followed with nephrology for kidney function. Denies f/c/s, n/v/d, hemoptysis, PND, leg swelling Denies chest pain or edema     Allergies  Allergen Reactions   Pineapple Swelling   Adhesive [Tape] Other (See Comments)    Skin irritation    Codeine Itching   Latex Other (See Comments)    Skin irritation    Shellfish Allergy     Immunization History  Administered Date(s) Administered   Hepatitis B, ADULT 08/10/2017, 09/11/2017   Influenza,inj,Quad PF,6+ Mos 11/11/2015, 08/11/2016, 08/10/2017, 07/02/2019   Influenza-Unspecified 07/07/2023   PFIZER(Purple Top)SARS-COV-2 Vaccination 12/29/2019, 02/25/2020   Tdap 08/11/2016   Unspecified SARS-COV-2 Vaccination 07/07/2023    Tobacco History: Social History   Tobacco Use  Smoking Status Never   Passive exposure: Past  Smokeless Tobacco Never   Counseling given: Not Answered   Outpatient Encounter Medications as of 11/23/2023  Medication Sig   acetaminophen (TYLENOL) 650 MG CR tablet Take 650 mg by mouth every 8 (eight) hours as needed for pain.   cyclobenzaprine (FLEXERIL) 10 MG tablet Take 1 tablet (10 mg total) by mouth 3 (three) times daily as needed for muscle spasms.   ferrous fumarate (HEMOCYTE -  106 MG FE) 325 (106 Fe) MG TABS tablet Take 1 tablet (106 mg of iron total) by mouth daily.   Iron-Vitamins (GERITOL COMPLETE PO) Take by mouth.   Vitamin D, Ergocalciferol, (DRISDOL) 1.25 MG (50000 UNIT) CAPS capsule Take 1 capsule (50,000 Units total) by mouth once a week.   [DISCONTINUED] amLODipine (NORVASC) 10 MG tablet Take 1 tablet (10 mg total) by mouth daily.   [DISCONTINUED] busPIRone (BUSPAR) 30 MG tablet Take 1 tablet (30 mg total) by mouth in the morning and at bedtime.   [DISCONTINUED] DULoxetine (CYMBALTA) 30 MG capsule Take 3 capsules (90 mg total) by mouth daily.   [DISCONTINUED] hydrOXYzine (ATARAX) 25 MG tablet Take 1 tablet (25 mg total) by mouth 3 (three) times daily as needed.   [DISCONTINUED] losartan (COZAAR) 100 MG tablet Take 1 tablet (100 mg total) by mouth daily.   amLODipine (NORVASC) 10 MG tablet Take 1 tablet (10 mg total) by mouth daily.   busPIRone (BUSPAR) 30 MG tablet Take 1 tablet (30 mg total) by mouth in the morning and at bedtime.   diazepam (VALIUM) 5 MG tablet Take 5 mg by mouth every 6 (six) hours as needed for anxiety. (Patient not taking: Reported on 11/23/2023)   DULoxetine (CYMBALTA) 30 MG capsule Take 3 capsules (90 mg total) by mouth daily.   hydrOXYzine (ATARAX) 25 MG tablet Take 1 tablet (25 mg total) by mouth 3 (three) times daily as needed.   Inositol Niacinate 500 MG CAPS Take by mouth. (Patient not taking: Reported on 11/23/2023)   losartan (COZAAR) 100 MG tablet Take  1 tablet (100 mg total) by mouth daily.   methocarbamol (ROBAXIN) 500 MG tablet Take 1 tablet (500 mg total) by mouth 3 (three) times daily. (Patient not taking: Reported on 11/23/2023)   Methylsulfonylmethane (MSM) 1000 MG TABS Take 1,000 mg by mouth daily. (Patient not taking: Reported on 11/23/2023)   norethindrone-ethinyl estradiol-iron (MICROGESTIN FE 1.5/30) 1.5-30 MG-MCG tablet Take 1 tablet by mouth daily.   omeprazole (PRILOSEC) 20 MG capsule Take 20 mg by mouth 2 (two) times  daily. (Patient not taking: Reported on 11/23/2023)   predniSONE (STERAPRED UNI-PAK 21 TAB) 10 MG (21) TBPK tablet Take as directed on the packaging (Patient not taking: Reported on 11/23/2023)   No facility-administered encounter medications on file as of 11/23/2023.    Review of Systems  Review of Systems  Constitutional: Negative.   HENT: Negative.    Cardiovascular: Negative.   Gastrointestinal: Negative.   Allergic/Immunologic: Negative.   Neurological: Negative.   Psychiatric/Behavioral: Negative.       Objective:   BP (!) 129/95   Pulse 78   Temp (!) 97.5 F (36.4 C)   Wt 225 lb (102.1 kg)   SpO2 99%   BMI 38.62 kg/m   Wt Readings from Last 5 Encounters:  11/23/23 225 lb (102.1 kg)  08/24/23 224 lb 8 oz (101.8 kg)  05/24/23 222 lb (100.7 kg)  05/16/23 223 lb (101.2 kg)  03/26/23 209 lb 9.6 oz (95.1 kg)     Physical Exam Vitals and nursing note reviewed.  Constitutional:      General: She is not in acute distress.    Appearance: She is well-developed.  Cardiovascular:     Rate and Rhythm: Normal rate and regular rhythm.  Pulmonary:     Effort: Pulmonary effort is normal.     Breath sounds: Normal breath sounds.  Neurological:     Mental Status: She is alert and oriented to person, place, and time.       Assessment & Plan:   Vitamin D deficiency -     VITAMIN D 25 Hydroxy (Vit-D Deficiency, Fractures)  Essential hypertension -     amLODIPine Besylate; Take 1 tablet (10 mg total) by mouth daily.  Dispense: 30 tablet; Refill: 0 -     Losartan Potassium; Take 1 tablet (100 mg total) by mouth daily.  Dispense: 30 tablet; Refill: 0  Anxiety -     busPIRone HCl; Take 1 tablet (30 mg total) by mouth in the morning and at bedtime.  Dispense: 60 tablet; Refill: 2 -     hydrOXYzine HCl; Take 1 tablet (25 mg total) by mouth 3 (three) times daily as needed.  Dispense: 90 tablet; Refill: 0  Reactive depression -     DULoxetine HCl; Take 3 capsules (90 mg  total) by mouth daily.  Dispense: 90 capsule; Refill: 0 -     hydrOXYzine HCl; Take 1 tablet (25 mg total) by mouth 3 (three) times daily as needed.  Dispense: 90 tablet; Refill: 0  Routine health maintenance -     DULoxetine HCl; Take 3 capsules (90 mg total) by mouth daily.  Dispense: 90 capsule; Refill: 0  Lipid screening -     Lipid panel     Return in about 3 months (around 02/20/2024).   Ivonne Andrew, NP 12/10/2023

## 2023-11-24 LAB — LIPID PANEL
Chol/HDL Ratio: 3.9 {ratio} (ref 0.0–4.4)
Cholesterol, Total: 163 mg/dL (ref 100–199)
HDL: 42 mg/dL (ref >39)
LDL Chol Calc (NIH): 101 mg/dL — ABNORMAL HIGH (ref 0–99)
Triglycerides: 108 mg/dL (ref 0–149)
VLDL Cholesterol Cal: 20 mg/dL (ref 5–40)

## 2023-11-24 LAB — VITAMIN D 25 HYDROXY (VIT D DEFICIENCY, FRACTURES): Vit D, 25-Hydroxy: 34.5 ng/mL (ref 30.0–100.0)

## 2023-11-26 ENCOUNTER — Encounter (HOSPITAL_COMMUNITY): Payer: Self-pay | Admitting: Clinical

## 2023-11-26 ENCOUNTER — Ambulatory Visit (INDEPENDENT_AMBULATORY_CARE_PROVIDER_SITE_OTHER): Payer: 59 | Admitting: Clinical

## 2023-11-26 DIAGNOSIS — F332 Major depressive disorder, recurrent severe without psychotic features: Secondary | ICD-10-CM | POA: Diagnosis not present
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
## 2023-12-10 ENCOUNTER — Encounter: Payer: Self-pay | Admitting: Nurse Practitioner

## 2023-12-10 ENCOUNTER — Ambulatory Visit (INDEPENDENT_AMBULATORY_CARE_PROVIDER_SITE_OTHER): Payer: 59 | Admitting: Clinical

## 2023-12-10 ENCOUNTER — Encounter (HOSPITAL_COMMUNITY): Payer: Self-pay | Admitting: Clinical

## 2023-12-10 DIAGNOSIS — F332 Major depressive disorder, recurrent severe without psychotic features: Secondary | ICD-10-CM

## 2023-12-10 NOTE — Progress Notes (Signed)
 THERAPIST PROGRESS NOTE  Session Time: 4:03-5:00pm   Session #6  Virtual Visit via Video Note  I connected with Victoria Curry on 12/10/23 at  4:00 PM EST by a video enabled telemedicine application and verified that I am speaking with the correct person using two identifiers.  Location: Patient: home Provider: outpatient therapy office   I discussed the limitations of evaluation and management by telemedicine and the availability of in person appointments. The patient expressed understanding and agreed to proceed.   I discussed the assessment and treatment plan with the patient. The patient was provided an opportunity to ask questions and all were answered. The patient agreed with the plan and demonstrated an understanding of the instructions.   The patient was advised to call back or seek an in-person evaluation if the symptoms worsen or if the condition fails to improve as anticipated.  I provided 57 minutes of non-face-to-face time during this encounter.  Lynnell Chad, LCSW    Participation Level: Active  Behavioral Response: Casual Alert Angry, Anxious, and Depressed  Type of Therapy: Individual Therapy  Treatment Goals addressed:  LTG: Victoria Curry will score less than 5 on the Generalized Anxiety Disorder 7 Scale (GAD-7) STG: Victoria Curry will participate in at least 80% of scheduled individual psychotherapy sessions STG: Victoria Curry will practice problem solving skills 3 times per week for the next 4 weeks. STG: Victoria Curry will reduce frequency of avoidant behaviors by 50% as evidenced by self-report in therapy sessions LTG: Increase coping skills to manage depression and improve ability to perform daily activities LTG: Victoria Curry will score less than 9 on the Patient Health Questionnaire (PHQ-9) STG: Victoria Curry will identify cognitive patterns and beliefs that support depression STG: Victoria Curry will practice behavioral activation skills 1-2 times per week for the next 12 weeks LTG:  Develop and implement effective coping skills to carry out normal responsibilities and participate constructively in relationships as evidenced by self-report LTG: Recall traumatic events without becoming overwhelmed with negative emotions STG: Victoria Curry will cooperate with a psychiatric evaluation as needed  and during all follow-up visits STG: Victoria Curry will acknowledge that healing from PTSD is a gradual process STG: Victoria Curry will identify negative coping strategies that have been used to cope with the feelings associated with the trauma STG: Victoria Curry will identify coping strategies to deal with trauma memories and the associated emotional reaction STG: Victoria Curry will verbalize an increased sense of mastery over PTSD symptoms by using several techniques to cope with flashbacks, decrease the power of triggers, and decrease negative thinking STG: Victoria Curry will follow a medication regimen as recommended by the provider and report any side effects that are experienced  ProgressTowards Goals: Progressing  Interventions: Supportive and Meditation: breathing,grief    Summary: Victoria Curry is a 58 y.o. female who presents with anxiety and depression.  She presented oriented x5 and stated she was feeling "it's been a lot."  CSW evaluated patient's medication compliance, use of coping tools, and self-care, as applicable.   She reflected on her grief over the last few weeks coming from the loss of a grandbaby that was due in 2 weeks, but did not survive.  Much of her processing this was involved with recriminations toward her son's ex-girlfriend for not heeding her advice to stay in [place] rather than subject herself to the stress of a trip back to [place].  She is convinced that, had the mother listened to her, the grandbaby would have been fine.  She has been spending her efforts on trying  to help her son cope, while continuing to feel like a burden to him.  CSW pointed out once again that this is a cognitive  distortion, simply from her statement that they rely on each other to work together financially to get the household needs met.  She verbalized the possibility of turning over her paycheck to her son and various options of this plan were explored at length.  She has not heard from the ex-boyfriend who often asks her for money, and she is adamant that this has to be due to him having a psychotic break, based on past experiences with him.    We explored once again the idea that she is seeking connection rather than romance and she reported she has reached out to 3 former friends who are all planning to get together to provide mutual support.  She is somewhat disturbed by dreams she has at times about being with somebody in a romantic sense.  However, she and her granddaughter are using music to go to sleep and she is sleeping better, not using her anxiety medicines as much.  CSW taught her 5-finger breathing and suggested she and her granddaughter use this prior to sleep, 20 breaths total.    She is concerned about her lack of mobility due to her car needing more repairs, and CSW reconnected this idea back to her not giving her money to men who ask for it, having her own little emergency fund going.  CSW also pointed her back to the idea that she and her son are a team financially and emotionally and that she is not a burden.  She verbalized understanding, if not agreement.  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  CSW encouraged her to look at this to see if there are any cognitive distortions going on.  Suicidal/Homicidal: No without intent/plan  Therapist Response:   Patient is progressing AEB engaging in scheduled therapy session.  Throughout the session, CSW gave patient the opportunity to explore thoughts and feelings associated with current life  situations and past/present stressors.   CSW challenged patient gently and appropriately to consider different ways of looking at reported issues. CSW encouraged patient's expression of feelings and validated these using empathy, active listening, open body language, and unconditional positive regard.   She was reminded of upcoming appointment.  Recommendations:  Return to therapy in 2 weeks, try to use Thought Stopping as taught in previous session, try 5-finger breathing especially with granddaughter before bed  Plan: Return again in 2 weeks on 3/3  Diagnosis:  Major depressive disorder, recurrent episode, severe with anxious distress (HCC)  Collaboration of Care: Primary Care Provider AEB - can see that patient is in therapy  Patient/Guardian was advised Release of Information must be obtained prior to any record release in order to collaborate their care with an outside provider. Patient/Guardian was advised if they have not already done so to contact the registration department to sign all necessary forms in order for [place] to release information regarding their care.   Consent: Patient/Guardian gives verbal consent for treatment and assignment of benefits for services provided during this visit. Patient/Guardian expressed understanding and agreed to proceed.   Lynnell Chad, LCSW 12/10/2023

## 2023-12-10 NOTE — Patient Instructions (Signed)
 1. Essential hypertension  - amLODipine (NORVASC) 10 MG tablet; Take 1 tablet (10 mg total) by mouth daily.  Dispense: 30 tablet; Refill: 0 - losartan (COZAAR) 100 MG tablet; Take 1 tablet (100 mg total) by mouth daily.  Dispense: 30 tablet; Refill: 0  2. Anxiety  - busPIRone (BUSPAR) 30 MG tablet; Take 1 tablet (30 mg total) by mouth in the morning and at bedtime.  Dispense: 60 tablet; Refill: 2 - hydrOXYzine (ATARAX) 25 MG tablet; Take 1 tablet (25 mg total) by mouth 3 (three) times daily as needed.  Dispense: 90 tablet; Refill: 0  3. Reactive depression  - DULoxetine (CYMBALTA) 30 MG capsule; Take 3 capsules (90 mg total) by mouth daily.  Dispense: 90 capsule; Refill: 0 - hydrOXYzine (ATARAX) 25 MG tablet; Take 1 tablet (25 mg total) by mouth 3 (three) times daily as needed.  Dispense: 90 tablet; Refill: 0  4. Routine health maintenance  - DULoxetine (CYMBALTA) 30 MG capsule; Take 3 capsules (90 mg total) by mouth daily.  Dispense: 90 capsule; Refill: 0  5. Vitamin D deficiency (Primary)  - Vitamin D, 25-hydroxy  6. Lipid screening  - Lipid Panel

## 2023-12-24 ENCOUNTER — Ambulatory Visit (INDEPENDENT_AMBULATORY_CARE_PROVIDER_SITE_OTHER): Payer: BC Managed Care – PPO | Admitting: Clinical

## 2023-12-24 ENCOUNTER — Encounter (HOSPITAL_COMMUNITY): Payer: Self-pay | Admitting: Clinical

## 2023-12-24 DIAGNOSIS — F332 Major depressive disorder, recurrent severe without psychotic features: Secondary | ICD-10-CM

## 2023-12-24 NOTE — Progress Notes (Unsigned)
 THERAPIST PROGRESS NOTE  Session Time: 4:05-4:52pm   Session #7  Virtual Visit via Video Note  I connected with [name] on 12/24/23 at  4:00 PM EST by a video enabled telemedicine application and verified that I am speaking with the correct person using two identifiers.  Location: Patient: home Provider: outpatient therapy office   I discussed the limitations of evaluation and management by telemedicine and the availability of in person appointments. The patient expressed understanding and agreed to proceed.   I discussed the assessment and treatment plan with the patient. The patient was provided an opportunity to ask questions and all were answered. The patient agreed with the plan and demonstrated an understanding of the instructions.   The patient was advised to call back or seek an in-person evaluation if the symptoms worsen or if the condition fails to improve as anticipated.  I provided 47 minutes of non-face-to-face time during this encounter.  Lynnell Chad, LCSW    Participation Level: Active  Behavioral Response: Casual Alert Depressed and Hopeless  Type of Therapy: Individual Therapy  Treatment Goals addressed:  LTG: Tarren will score less than 5 on the Generalized Anxiety Disorder 7 Scale (GAD-7) STG: Makaya will participate in at least 80% of scheduled individual psychotherapy sessions STG: Matika will practice problem solving skills 3 times per week for the next 4 weeks. STG: Nura will reduce frequency of avoidant behaviors by 50% as evidenced by self-report in therapy sessions LTG: Increase coping skills to manage depression and improve ability to perform daily activities LTG: Marquis will score less than 9 on the Patient Health Questionnaire (PHQ-9) STG: Zilah will identify cognitive patterns and beliefs that support depression STG: Alyvia will practice behavioral activation skills 1-2 times per week for the next 12 weeks LTG: Develop and  implement effective coping skills to carry out normal responsibilities and participate constructively in relationships as evidenced by self-report LTG: Recall traumatic events without becoming overwhelmed with negative emotions STG: Blimi will cooperate with a psychiatric evaluation as needed  and during all follow-up visits STG: Maybel will acknowledge that healing from PTSD is a gradual process STG: Arnette will identify negative coping strategies that have been used to cope with the feelings associated with the trauma STG: Glynis will identify coping strategies to deal with trauma memories and the associated emotional reaction STG: Bethan will verbalize an increased sense of mastery over PTSD symptoms by using several techniques to cope with flashbacks, decrease the power of triggers, and decrease negative thinking STG: Taiylor will follow a medication regimen as recommended by the provider and report any side effects that are experienced  ProgressTowards Goals: Progressing  Interventions: Solution Focused and Supportive   Summary: EDEN TOOHEY is a 58 y.o. female who presents with anxiety and depression.  She presented oriented x5 and stated she was feeling "***."  CSW evaluated patient's medication compliance, use of coping tools, and self-care, as applicable.   ***  Suicidal/Homicidal: No without intent/plan  Therapist Response:   Patient is progressing AEB engaging in scheduled therapy session.  Throughout the session, CSW gave patient the opportunity to explore thoughts and feelings associated with current life situations and past/present stressors.   CSW challenged patient gently and appropriately to consider different ways of looking at reported issues. CSW encouraged patient's expression of feelings and validated these using empathy, active listening, open body language, and unconditional positive regard.   She was reminded of dates of upcoming appointments.  Recommendations:  Return  to  therapy in 6 weeks, remain focused on gratitude and things she can change rather than negativity and things she cannot change.  Plan: Return again in 6 weeks on 4/23 (first available)  Diagnosis:  Major depressive disorder, recurrent episode, severe with anxious distress (HCC)  Collaboration of Care: Primary Care Provider AEB - can see that patient is in therapy  Patient/Guardian was advised Release of Information must be obtained prior to any record release in order to collaborate their care with an outside provider. Patient/Guardian was advised if they have not already done so to contact the registration department to sign all necessary forms in order for [place] to release information regarding their care.   Consent: Patient/Guardian gives verbal consent for treatment and assignment of benefits for services provided during this visit. Patient/Guardian expressed understanding and agreed to proceed.   Lynnell Chad, LCSW 12/24/2023

## 2024-01-15 ENCOUNTER — Other Ambulatory Visit: Payer: Self-pay | Admitting: Nurse Practitioner

## 2024-01-15 DIAGNOSIS — Z0000 Encounter for general adult medical examination without abnormal findings: Secondary | ICD-10-CM

## 2024-01-15 DIAGNOSIS — I10 Essential (primary) hypertension: Secondary | ICD-10-CM

## 2024-01-15 DIAGNOSIS — F329 Major depressive disorder, single episode, unspecified: Secondary | ICD-10-CM

## 2024-01-16 ENCOUNTER — Other Ambulatory Visit: Payer: Self-pay | Admitting: Nurse Practitioner

## 2024-01-16 ENCOUNTER — Other Ambulatory Visit: Payer: Self-pay

## 2024-01-16 DIAGNOSIS — F329 Major depressive disorder, single episode, unspecified: Secondary | ICD-10-CM

## 2024-01-16 DIAGNOSIS — Z0000 Encounter for general adult medical examination without abnormal findings: Secondary | ICD-10-CM

## 2024-01-16 DIAGNOSIS — I10 Essential (primary) hypertension: Secondary | ICD-10-CM

## 2024-01-16 MED ORDER — AMLODIPINE BESYLATE 10 MG PO TABS
10.0000 mg | ORAL_TABLET | Freq: Every day | ORAL | 0 refills | Status: DC
Start: 2024-01-16 — End: 2024-03-03

## 2024-01-16 MED ORDER — DULOXETINE HCL 30 MG PO CPEP
90.0000 mg | ORAL_CAPSULE | Freq: Every day | ORAL | 0 refills | Status: DC
Start: 2024-01-16 — End: 2024-03-03

## 2024-01-16 MED ORDER — LOSARTAN POTASSIUM 100 MG PO TABS
100.0000 mg | ORAL_TABLET | Freq: Every day | ORAL | 0 refills | Status: DC
Start: 2024-01-16 — End: 2024-03-03

## 2024-02-13 ENCOUNTER — Encounter (HOSPITAL_COMMUNITY): Payer: Self-pay | Admitting: Clinical

## 2024-02-13 ENCOUNTER — Ambulatory Visit (INDEPENDENT_AMBULATORY_CARE_PROVIDER_SITE_OTHER): Payer: 59 | Admitting: Clinical

## 2024-02-13 DIAGNOSIS — F332 Major depressive disorder, recurrent severe without psychotic features: Secondary | ICD-10-CM
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
## 2024-02-25 ENCOUNTER — Ambulatory Visit (INDEPENDENT_AMBULATORY_CARE_PROVIDER_SITE_OTHER): Payer: 59 | Admitting: Clinical

## 2024-02-25 ENCOUNTER — Encounter (HOSPITAL_COMMUNITY): Payer: Self-pay | Admitting: Clinical

## 2024-02-25 DIAGNOSIS — F332 Major depressive disorder, recurrent severe without psychotic features: Secondary | ICD-10-CM | POA: Diagnosis not present

## 2024-03-02 ENCOUNTER — Other Ambulatory Visit: Payer: Self-pay | Admitting: Nurse Practitioner

## 2024-03-02 DIAGNOSIS — F329 Major depressive disorder, single episode, unspecified: Secondary | ICD-10-CM

## 2024-03-02 DIAGNOSIS — Z0000 Encounter for general adult medical examination without abnormal findings: Secondary | ICD-10-CM

## 2024-03-02 DIAGNOSIS — I10 Essential (primary) hypertension: Secondary | ICD-10-CM

## 2024-03-29 ENCOUNTER — Other Ambulatory Visit: Payer: Self-pay | Admitting: Nurse Practitioner

## 2024-03-29 DIAGNOSIS — I10 Essential (primary) hypertension: Secondary | ICD-10-CM

## 2024-03-29 DIAGNOSIS — F329 Major depressive disorder, single episode, unspecified: Secondary | ICD-10-CM

## 2024-03-29 DIAGNOSIS — Z0000 Encounter for general adult medical examination without abnormal findings: Secondary | ICD-10-CM

## 2024-03-31 ENCOUNTER — Telehealth (HOSPITAL_COMMUNITY): Payer: Self-pay | Admitting: Clinical

## 2024-03-31 NOTE — Telephone Encounter (Signed)
 Please advise [facility]

## 2024-04-02 ENCOUNTER — Ambulatory Visit (INDEPENDENT_AMBULATORY_CARE_PROVIDER_SITE_OTHER): Admitting: Clinical

## 2024-04-02 DIAGNOSIS — Z91198 Patient's noncompliance with other medical treatment and regimen for other reason: Secondary | ICD-10-CM

## 2024-04-02 NOTE — Progress Notes (Signed)
 Patient ID: Victoria Curry, female   DOB: 10/30/1965, 58 y.o.   MRN: [id number]  Victoria Curry    CSW attempted to connect with patient for scheduled appointment via MyChart video text request x 2 and email request with no response; also attempted to connect via phone without success. CSW left message for patient expressing concern.      Attempt 1: Text and email: 4:02pm      Attempt 2: Text: 4:09pm       Attempt 3: Phone call / personal text: 4:13pm      Left video chat open until:  4:20pm  she did text yesterday that she would be in a graduation, hoped it would be out on time      Per [facility] policy, after multiple attempts to reach patient unsuccessfully at appointed time, visit will be coded as a no show.    Encounter Diagnosis  Name Primary?   Failure to attend appointment with reason given Yes       Leotha Rang, LCSW 04/02/2024, 4:30 PM

## 2024-04-03 ENCOUNTER — Ambulatory Visit (HOSPITAL_COMMUNITY): Admitting: Clinical

## 2024-04-03 ENCOUNTER — Encounter (HOSPITAL_COMMUNITY): Payer: Self-pay | Admitting: Clinical

## 2024-04-03 DIAGNOSIS — F332 Major depressive disorder, recurrent severe without psychotic features: Secondary | ICD-10-CM | POA: Diagnosis not present
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
## 2024-05-02 ENCOUNTER — Encounter (HOSPITAL_COMMUNITY): Payer: Self-pay | Admitting: Clinical

## 2024-05-02 ENCOUNTER — Ambulatory Visit (INDEPENDENT_AMBULATORY_CARE_PROVIDER_SITE_OTHER): Admitting: Clinical

## 2024-05-02 DIAGNOSIS — F332 Major depressive disorder, recurrent severe without psychotic features: Secondary | ICD-10-CM
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
## 2024-05-16 ENCOUNTER — Encounter (HOSPITAL_COMMUNITY): Payer: Self-pay | Admitting: Clinical

## 2024-05-16 ENCOUNTER — Ambulatory Visit (HOSPITAL_COMMUNITY): Admitting: Clinical

## 2024-05-16 DIAGNOSIS — F332 Major depressive disorder, recurrent severe without psychotic features: Secondary | ICD-10-CM | POA: Diagnosis not present
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
## 2024-05-22 ENCOUNTER — Encounter: Payer: Self-pay | Admitting: Nurse Practitioner

## 2024-05-22 ENCOUNTER — Telehealth: Payer: Self-pay | Admitting: Nurse Practitioner

## 2024-05-22 VITALS — Ht 67.0 in | Wt 225.0 lb

## 2024-05-22 DIAGNOSIS — I10 Essential (primary) hypertension: Secondary | ICD-10-CM

## 2024-05-22 DIAGNOSIS — F329 Major depressive disorder, single episode, unspecified: Secondary | ICD-10-CM

## 2024-05-22 DIAGNOSIS — E559 Vitamin D deficiency, unspecified: Secondary | ICD-10-CM | POA: Diagnosis not present

## 2024-05-22 DIAGNOSIS — F419 Anxiety disorder, unspecified: Secondary | ICD-10-CM | POA: Diagnosis not present

## 2024-05-22 DIAGNOSIS — E611 Iron deficiency: Secondary | ICD-10-CM

## 2024-05-22 DIAGNOSIS — Z13220 Encounter for screening for lipoid disorders: Secondary | ICD-10-CM

## 2024-05-22 DIAGNOSIS — Z3041 Encounter for surveillance of contraceptive pills: Secondary | ICD-10-CM

## 2024-05-22 DIAGNOSIS — Z0000 Encounter for general adult medical examination without abnormal findings: Secondary | ICD-10-CM

## 2024-05-22 MED ORDER — BUSPIRONE HCL 30 MG PO TABS: 30.0000 mg | ORAL_TABLET | Freq: Two times a day (BID) | ORAL | 2 refills | Status: DC

## 2024-05-22 MED ORDER — NORETHIN ACE-ETH ESTRAD-FE 1.5-30 MG-MCG PO TABS: 1.0000 | ORAL_TABLET | Freq: Every day | ORAL | 2 refills | Status: AC

## 2024-05-22 MED ORDER — LOSARTAN POTASSIUM 100 MG PO TABS: 100.0000 mg | ORAL_TABLET | Freq: Every day | ORAL | 0 refills | Status: DC

## 2024-05-22 MED ORDER — DULOXETINE HCL 30 MG PO CPEP: 90.0000 mg | ORAL_CAPSULE | Freq: Every day | ORAL | 0 refills | Status: DC

## 2024-05-22 MED ORDER — AMLODIPINE BESYLATE 10 MG PO TABS
10.0000 mg | ORAL_TABLET | Freq: Every day | ORAL | 0 refills | Status: DC
Start: 2024-05-22 — End: 2024-08-18

## 2024-05-22 NOTE — Progress Notes (Signed)
 Virtual Visit via Video Note  I connected with Victoria Curry on 05/22/24 at  3:00 PM EDT by a video enabled telemedicine application and verified that I am speaking with the correct person using two identifiers.  Location: Patient: home Provider: office   I discussed the limitations of evaluation and management by telemedicine and the availability of in person appointments. The patient expressed understanding and agreed to proceed.  History of Present Illness:  Patient presents today for follow-up on hypertension.  Overall patient is doing well.  She does need refill on medications today.  She is having a lot of stress right now related to college schoolwork.  She does currently have a therapist and is working on Pharmacologist.  She will return for a lab visit. denies f/c/s, n/v/d, hemoptysis, PND, leg swelling Denies chest pain or edema    Observations/Objective:     05/22/2024    2:58 PM 11/23/2023    3:40 PM 08/24/2023    3:48 PM  Vitals with BMI  Height 5' 7  5' 4  Weight 225 lbs 225 lbs 224 lbs 8 oz  BMI 35.23  38.52  Systolic  129 135  Diastolic  95 92  Pulse  78 102      Assessment and Plan:  1. Essential hypertension  - amLODipine  (NORVASC ) 10 MG tablet; Take 1 tablet (10 mg total) by mouth daily.  Dispense: 30 tablet; Refill: 0 - losartan  (COZAAR ) 100 MG tablet; Take 1 tablet (100 mg total) by mouth daily.  Dispense: 30 tablet; Refill: 0  2. Anxiety  - busPIRone  (BUSPAR ) 30 MG tablet; Take 1 tablet (30 mg total) by mouth in the morning and at bedtime.  Dispense: 60 tablet; Refill: 2  3. Reactive depression  - DULoxetine  (CYMBALTA ) 30 MG capsule; Take 3 capsules (90 mg total) by mouth daily.  Dispense: 90 capsule; Refill: 0  4. Routine health maintenance  - DULoxetine  (CYMBALTA ) 30 MG capsule; Take 3 capsules (90 mg total) by mouth daily.  Dispense: 90 capsule; Refill: 0 - CBC; Future - Comprehensive metabolic panel with GFR; Future  5. Iron  deficiency   6. Encounter for surveillance of contraceptive pills  - norethindrone -ethinyl estradiol-iron (MICROGESTIN FE 1.5/30) 1.5-30 MG-MCG tablet; Take 1 tablet by mouth daily.  Dispense: 30 tablet; Refill: 2  7. Vitamin D  deficiency (Primary)  - Vitamin D , 25-hydroxy; Future  8. Lipid screening  - Lipid Panel; Future     I discussed the assessment and treatment plan with the patient. The patient was provided an opportunity to ask questions and all were answered. The patient agreed with the plan and demonstrated an understanding of the instructions.   The patient was advised to call back or seek an in-person evaluation if the symptoms worsen or if the condition fails to improve as anticipated.  I provided 23 minutes of non-face-to-face time during this encounter.   Bascom GORMAN Borer, NP

## 2024-06-13 ENCOUNTER — Encounter (HOSPITAL_COMMUNITY): Payer: Self-pay

## 2024-06-13 ENCOUNTER — Ambulatory Visit (INDEPENDENT_AMBULATORY_CARE_PROVIDER_SITE_OTHER): Payer: Self-pay | Admitting: Clinical

## 2024-06-13 DIAGNOSIS — Z91199 Patient's noncompliance with other medical treatment and regimen due to unspecified reason: Secondary | ICD-10-CM

## 2024-06-13 NOTE — Progress Notes (Signed)
 [name]    CSW attempted to connect with patient for scheduled appointment via MyChart video text request x 2 and email request with no response; also attempted to connect via phone without success. CSW left message for patient to call office to reschedule therapy appointment.        Attempt 1: Text and email: 10:06am      Attempt 2: Text: 10:13am       Attempt 3: Phone call and HIPAA-compliant voicemail: 10:15am      Left video chat open until:  10:17am      Per [facility] policy, after multiple attempts to reach patient unsuccessfully at appointed time, visit will be coded as a no show.    Encounter Diagnosis  Name Primary?   No-show for appointment Yes       Elgie Crest, LCSW 06/13/2024, 10:17 AM

## 2024-06-22 ENCOUNTER — Other Ambulatory Visit: Payer: Self-pay | Admitting: Nurse Practitioner

## 2024-06-22 DIAGNOSIS — F329 Major depressive disorder, single episode, unspecified: Secondary | ICD-10-CM

## 2024-06-22 DIAGNOSIS — Z0000 Encounter for general adult medical examination without abnormal findings: Secondary | ICD-10-CM

## 2024-06-24 NOTE — Telephone Encounter (Signed)
 Please advise [facility]

## 2024-06-27 ENCOUNTER — Ambulatory Visit (HOSPITAL_COMMUNITY): Admitting: Clinical

## 2024-06-27 DIAGNOSIS — F332 Major depressive disorder, recurrent severe without psychotic features: Secondary | ICD-10-CM
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
## 2024-06-30 ENCOUNTER — Encounter (HOSPITAL_COMMUNITY): Payer: Self-pay | Admitting: Clinical

## 2024-08-07 ENCOUNTER — Telehealth: Payer: Self-pay | Admitting: Nurse Practitioner

## 2024-08-07 ENCOUNTER — Ambulatory Visit: Payer: Self-pay | Admitting: Nurse Practitioner

## 2024-08-08 ENCOUNTER — Ambulatory Visit (INDEPENDENT_AMBULATORY_CARE_PROVIDER_SITE_OTHER): Payer: Self-pay | Admitting: Nurse Practitioner

## 2024-08-08 ENCOUNTER — Encounter: Payer: Self-pay | Admitting: Nurse Practitioner

## 2024-08-08 VITALS — BP 139/98 | HR 84 | Ht 67.0 in | Wt 216.0 lb

## 2024-08-08 DIAGNOSIS — R413 Other amnesia: Secondary | ICD-10-CM

## 2024-08-08 DIAGNOSIS — F32A Depression, unspecified: Secondary | ICD-10-CM | POA: Diagnosis not present

## 2024-08-08 DIAGNOSIS — F419 Anxiety disorder, unspecified: Secondary | ICD-10-CM | POA: Diagnosis not present

## 2024-08-08 NOTE — Patient Instructions (Addendum)

## 2024-08-08 NOTE — Progress Notes (Signed)
 Subjective   Patient ID: Victoria Curry, female    DOB: 06/27/1966, 58 y.o.   MRN: [id number]  Chief Complaint  Patient presents with   Anxiety    Referring provider: Oley Bascom RAMAN, NP  Victoria Curry is a 58 y.o. female with Past Medical History: No date: Anemia 12/2022: Chronic kidney disease (CKD) No date: Depression 11/2002: Fibromyalgia No date: GERD (gastroesophageal reflux disease) No date: Hypertension 12/2020: Vitamin D  deficiency   HPI  Patient presents today with anxiety and depression.  She is also concerned because she has been more forgetful than usual lately.  We discussed that this could be due to increased anxiety but the patient does have a strong family history of dementia.  We will place a referral for her for psychiatry for medication management and also to neurology for evaluation for memory loss.  Patient is currently under virtual counseling.  She is on medications but thinks she may need a switch in her medications.  We will let psychiatry decide what would be the best option for her. Denies f/c/s, n/v/d, hemoptysis, PND, leg swelling Denies chest pain or edema      Allergies  Allergen Reactions   Pineapple Swelling   Adhesive [Tape] Other (See Comments)    Skin irritation    Codeine Itching   Latex Other (See Comments)    Skin irritation    Shellfish Allergy     Immunization History  Administered Date(s) Administered   Hepatitis B, ADULT 08/10/2017, 09/11/2017   Influenza,inj,Quad PF,6+ Mos 11/11/2015, 08/11/2016, 08/10/2017, 07/02/2019   Influenza-Unspecified 07/07/2023   PFIZER(Purple Top)SARS-COV-2 Vaccination 12/29/2019, 02/25/2020   Tdap 08/11/2016   Unspecified SARS-COV-2 Vaccination 07/07/2023    Tobacco History: Social History   Tobacco Use  Smoking Status Never   Passive exposure: Past  Smokeless Tobacco Never   Counseling given: Not Answered   Outpatient Encounter Medications as of 08/08/2024  Medication Sig    acetaminophen (TYLENOL) 650 MG CR tablet Take 650 mg by mouth every 8 (eight) hours as needed for pain.   amLODipine  (NORVASC ) 10 MG tablet Take 1 tablet (10 mg total) by mouth daily.   busPIRone  (BUSPAR ) 30 MG tablet Take 1 tablet (30 mg total) by mouth in the morning and at bedtime.   DULoxetine  (CYMBALTA ) 30 MG capsule TAKE 3 CAPSULES BY MOUTH ONCE DAILY   hydrOXYzine  (ATARAX ) 25 MG tablet Take 1 tablet (25 mg total) by mouth 3 (three) times daily as needed.   Iron-Vitamins (GERITOL COMPLETE PO) Take by mouth.   losartan  (COZAAR ) 100 MG tablet Take 1 tablet (100 mg total) by mouth daily.   norethindrone -ethinyl estradiol-iron (MICROGESTIN FE 1.5/30) 1.5-30 MG-MCG tablet Take 1 tablet by mouth daily.   [DISCONTINUED] cyclobenzaprine  (FLEXERIL ) 10 MG tablet Take 1 tablet (10 mg total) by mouth 3 (three) times daily as needed for muscle spasms.   [DISCONTINUED] diazepam (VALIUM) 5 MG tablet Take 5 mg by mouth every 6 (six) hours as needed for anxiety. (Patient not taking: Reported on 11/23/2023)   [DISCONTINUED] ferrous fumarate  (HEMOCYTE - 106 MG FE) 325 (106 Fe) MG TABS tablet Take 1 tablet (106 mg of iron total) by mouth daily.   [DISCONTINUED] Inositol Niacinate 500 MG CAPS Take by mouth. (Patient not taking: Reported on 11/23/2023)   [DISCONTINUED] methocarbamol  (ROBAXIN ) 500 MG tablet Take 1 tablet (500 mg total) by mouth 3 (three) times daily. (Patient not taking: Reported on 11/23/2023)   [DISCONTINUED] Methylsulfonylmethane (MSM) 1000 MG TABS Take 1,000 mg by mouth  daily. (Patient not taking: Reported on 11/23/2023)   [DISCONTINUED] omeprazole  (PRILOSEC) 20 MG capsule Take 20 mg by mouth 2 (two) times daily. (Patient not taking: Reported on 11/23/2023)   [DISCONTINUED] predniSONE  (STERAPRED UNI-PAK 21 TAB) 10 MG (21) TBPK tablet Take as directed on the packaging (Patient not taking: Reported on 11/23/2023)   [DISCONTINUED] Vitamin D , Ergocalciferol , (DRISDOL ) 1.25 MG (50000 UNIT) CAPS capsule  Take 1 capsule (50,000 Units total) by mouth once a week.   No facility-administered encounter medications on file as of 08/08/2024.    Review of Systems  Review of Systems  Constitutional: Negative.   HENT: Negative.    Cardiovascular: Negative.   Gastrointestinal: Negative.   Allergic/Immunologic: Negative.   Neurological: Negative.   Psychiatric/Behavioral: Negative.       Objective:   BP (!) 139/98   Pulse 84   Ht 5' 7 (1.702 m)   Wt 216 lb (98 kg)   BMI 33.83 kg/m   Wt Readings from Last 5 Encounters:  08/08/24 216 lb (98 kg)  05/22/24 225 lb (102.1 kg)  11/23/23 225 lb (102.1 kg)  08/24/23 224 lb 8 oz (101.8 kg)  05/24/23 222 lb (100.7 kg)     Physical Exam Vitals and nursing note reviewed.  Constitutional:      General: She is not in acute distress.    Appearance: She is well-developed.  Cardiovascular:     Rate and Rhythm: Normal rate and regular rhythm.  Pulmonary:     Effort: Pulmonary effort is normal.     Breath sounds: Normal breath sounds.  Neurological:     Mental Status: She is alert and oriented to person, place, and time.       Assessment & Plan:   Anxiety and depression -     CBC -     Comprehensive metabolic panel with GFR -     TSH -     Ambulatory referral to Psychiatry  Memory loss -     Ambulatory referral to Neurology     Return in about 3 months (around 11/08/2024).     Bascom GORMAN Borer, NP 08/08/2024

## 2024-08-09 LAB — COMPREHENSIVE METABOLIC PANEL WITH GFR
ALT: 16 IU/L (ref 0–32)
AST: 19 IU/L (ref 0–40)
Albumin: 4.4 g/dL (ref 3.8–4.9)
Alkaline Phosphatase: 110 IU/L (ref 49–135)
BUN/Creatinine Ratio: 13 (ref 9–23)
BUN: 12 mg/dL (ref 6–24)
Bilirubin Total: 0.3 mg/dL (ref 0.0–1.2)
CO2: 23 mmol/L (ref 20–29)
Calcium: 9.7 mg/dL (ref 8.7–10.2)
Chloride: 101 mmol/L (ref 96–106)
Creatinine, Ser: 0.91 mg/dL (ref 0.57–1.00)
Globulin, Total: 3.1 g/dL (ref 1.5–4.5)
Glucose: 76 mg/dL (ref 70–99)
Potassium: 4.4 mmol/L (ref 3.5–5.2)
Sodium: 141 mmol/L (ref 134–144)
Total Protein: 7.5 g/dL (ref 6.0–8.5)
eGFR: 73 mL/min/1.73 (ref >59)

## 2024-08-09 LAB — CBC
Hematocrit: 42.4 % (ref 34.0–46.6)
Hemoglobin: 13.7 g/dL (ref 11.1–15.9)
MCH: 31.2 pg (ref 26.6–33.0)
MCHC: 32.3 g/dL (ref 31.5–35.7)
MCV: 97 fL (ref 79–97)
Platelets: 295 x10E3/uL (ref 150–450)
RBC: 4.39 x10E6/uL (ref 3.77–5.28)
RDW: 12.3 % (ref 11.7–15.4)
WBC: 9.5 x10E3/uL (ref 3.4–10.8)

## 2024-08-09 LAB — TSH: TSH: 1.02 u[IU]/mL (ref 0.450–4.500)

## 2024-08-11 ENCOUNTER — Ambulatory Visit: Payer: Self-pay | Admitting: Nurse Practitioner

## 2024-08-13 NOTE — Telephone Encounter (Signed)
 Error

## 2024-08-16 ENCOUNTER — Other Ambulatory Visit: Payer: Self-pay | Admitting: Nurse Practitioner

## 2024-08-16 DIAGNOSIS — Z0000 Encounter for general adult medical examination without abnormal findings: Secondary | ICD-10-CM

## 2024-08-16 DIAGNOSIS — F329 Major depressive disorder, single episode, unspecified: Secondary | ICD-10-CM

## 2024-08-16 DIAGNOSIS — F419 Anxiety disorder, unspecified: Secondary | ICD-10-CM

## 2024-08-16 DIAGNOSIS — I10 Essential (primary) hypertension: Secondary | ICD-10-CM

## 2024-08-18 NOTE — Telephone Encounter (Signed)
 Please advise Victoria Curry , and pt needs a follow up appt. Thank you all. KH

## 2024-08-19 ENCOUNTER — Ambulatory Visit (INDEPENDENT_AMBULATORY_CARE_PROVIDER_SITE_OTHER): Admitting: Clinical

## 2024-08-19 ENCOUNTER — Encounter (HOSPITAL_COMMUNITY): Payer: Self-pay | Admitting: Clinical

## 2024-08-19 DIAGNOSIS — F332 Major depressive disorder, recurrent severe without psychotic features: Secondary | ICD-10-CM | POA: Diagnosis not present
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
## 2024-09-02 ENCOUNTER — Ambulatory Visit (INDEPENDENT_AMBULATORY_CARE_PROVIDER_SITE_OTHER): Admitting: Clinical

## 2024-09-02 ENCOUNTER — Encounter (HOSPITAL_COMMUNITY): Payer: Self-pay | Admitting: Clinical

## 2024-09-02 DIAGNOSIS — F332 Major depressive disorder, recurrent severe without psychotic features: Secondary | ICD-10-CM
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
## 2024-09-16 ENCOUNTER — Ambulatory Visit (INDEPENDENT_AMBULATORY_CARE_PROVIDER_SITE_OTHER): Admitting: Clinical

## 2024-09-16 DIAGNOSIS — F332 Major depressive disorder, recurrent severe without psychotic features: Secondary | ICD-10-CM
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
## 2024-09-20 ENCOUNTER — Encounter (HOSPITAL_COMMUNITY): Payer: Self-pay | Admitting: Clinical

## 2024-10-01 ENCOUNTER — Ambulatory Visit (HOSPITAL_BASED_OUTPATIENT_CLINIC_OR_DEPARTMENT_OTHER): Admitting: Student in an Organized Health Care Education/Training Program

## 2024-10-01 ENCOUNTER — Encounter (HOSPITAL_COMMUNITY): Payer: Self-pay | Admitting: Student in an Organized Health Care Education/Training Program

## 2024-10-01 DIAGNOSIS — F419 Anxiety disorder, unspecified: Secondary | ICD-10-CM | POA: Diagnosis not present

## 2024-10-01 DIAGNOSIS — Z0000 Encounter for general adult medical examination without abnormal findings: Secondary | ICD-10-CM

## 2024-10-01 DIAGNOSIS — F329 Major depressive disorder, single episode, unspecified: Secondary | ICD-10-CM | POA: Diagnosis not present

## 2024-10-01 MED ORDER — DULOXETINE HCL 30 MG PO CPEP: 90.0000 mg | ORAL_CAPSULE | Freq: Every day | ORAL | 2 refills | Status: DC

## 2024-10-01 MED ORDER — BUSPIRONE HCL 30 MG PO TABS: 30.0000 mg | ORAL_TABLET | Freq: Two times a day (BID) | ORAL | 2 refills | Status: DC
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
## 2024-10-02 NOTE — Addendum Note (Signed)
 Addended by: CARVIN CROCK on: 10/02/2024 09:54 AM   Modules accepted: Level of Service

## 2024-10-08 ENCOUNTER — Ambulatory Visit (INDEPENDENT_AMBULATORY_CARE_PROVIDER_SITE_OTHER): Admitting: Clinical

## 2024-10-08 ENCOUNTER — Encounter (HOSPITAL_COMMUNITY): Payer: Self-pay | Admitting: Clinical

## 2024-10-08 DIAGNOSIS — F419 Anxiety disorder, unspecified: Secondary | ICD-10-CM

## 2024-10-08 DIAGNOSIS — F329 Major depressive disorder, single episode, unspecified: Secondary | ICD-10-CM
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
## 2024-10-17 ENCOUNTER — Ambulatory Visit (INDEPENDENT_AMBULATORY_CARE_PROVIDER_SITE_OTHER): Admitting: Clinical

## 2024-10-17 DIAGNOSIS — F329 Major depressive disorder, single episode, unspecified: Secondary | ICD-10-CM | POA: Diagnosis not present

## 2024-10-17 DIAGNOSIS — F419 Anxiety disorder, unspecified: Secondary | ICD-10-CM
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
## 2024-10-19 ENCOUNTER — Encounter (HOSPITAL_COMMUNITY): Payer: Self-pay | Admitting: Clinical

## 2024-11-06 ENCOUNTER — Encounter (HOSPITAL_COMMUNITY): Payer: Self-pay | Admitting: Clinical

## 2024-11-06 ENCOUNTER — Ambulatory Visit (HOSPITAL_COMMUNITY): Admitting: Clinical

## 2024-11-06 DIAGNOSIS — F329 Major depressive disorder, single episode, unspecified: Secondary | ICD-10-CM

## 2024-11-06 DIAGNOSIS — F419 Anxiety disorder, unspecified: Secondary | ICD-10-CM | POA: Diagnosis not present
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
## 2024-11-12 ENCOUNTER — Ambulatory Visit (HOSPITAL_COMMUNITY): Admitting: Clinical

## 2024-11-12 ENCOUNTER — Ambulatory Visit (HOSPITAL_COMMUNITY): Admitting: Student in an Organized Health Care Education/Training Program

## 2024-11-12 DIAGNOSIS — F419 Anxiety disorder, unspecified: Secondary | ICD-10-CM

## 2024-11-12 DIAGNOSIS — F329 Major depressive disorder, single episode, unspecified: Secondary | ICD-10-CM

## 2024-11-12 DIAGNOSIS — Z0000 Encounter for general adult medical examination without abnormal findings: Secondary | ICD-10-CM

## 2024-11-12 MED ORDER — BUSPIRONE HCL 30 MG PO TABS: 30.0000 mg | ORAL_TABLET | Freq: Two times a day (BID) | ORAL | 2 refills | Status: AC

## 2024-11-12 MED ORDER — BUSPIRONE HCL 30 MG PO TABS: 45.0000 mg | ORAL_TABLET | Freq: Two times a day (BID) | ORAL | 1 refills | Status: DC

## 2024-11-12 MED ORDER — DULOXETINE HCL 60 MG PO CPEP: 120.0000 mg | ORAL_CAPSULE | Freq: Every day | ORAL | 2 refills | Status: AC
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
## 2024-11-19 ENCOUNTER — Ambulatory Visit (HOSPITAL_COMMUNITY): Admitting: Clinical

## 2024-11-25 ENCOUNTER — Ambulatory Visit (HOSPITAL_COMMUNITY): Admitting: Clinical

## 2024-12-03 ENCOUNTER — Ambulatory Visit (HOSPITAL_COMMUNITY): Admitting: Clinical

## 2024-12-15 ENCOUNTER — Ambulatory Visit (HOSPITAL_COMMUNITY): Admitting: Student in an Organized Health Care Education/Training Program

## 2024-12-16 ENCOUNTER — Ambulatory Visit (HOSPITAL_COMMUNITY): Admitting: Clinical

## 2024-12-31 ENCOUNTER — Ambulatory Visit: Admitting: Neurology
